# Patient Record
Sex: Male | Born: 1945 | Race: Black or African American | Hispanic: No | Marital: Married | Smoking: Former smoker
Health system: Southern US, Community
[De-identification: ages and names within clinical notes are randomized; demographics above are authoritative.]

## PROBLEM LIST (undated history)

## (undated) DIAGNOSIS — G4730 Sleep apnea, unspecified: Secondary | ICD-10-CM

## (undated) DIAGNOSIS — M1990 Unspecified osteoarthritis, unspecified site: Secondary | ICD-10-CM

## (undated) DIAGNOSIS — J45909 Unspecified asthma, uncomplicated: Secondary | ICD-10-CM

## (undated) DIAGNOSIS — F99 Mental disorder, not otherwise specified: Secondary | ICD-10-CM

## (undated) DIAGNOSIS — K219 Gastro-esophageal reflux disease without esophagitis: Secondary | ICD-10-CM

## (undated) DIAGNOSIS — F4310 Post-traumatic stress disorder, unspecified: Secondary | ICD-10-CM

## (undated) DIAGNOSIS — C801 Malignant (primary) neoplasm, unspecified: Secondary | ICD-10-CM

## (undated) DIAGNOSIS — Z8719 Personal history of other diseases of the digestive system: Secondary | ICD-10-CM

## (undated) DIAGNOSIS — T884XXA Failed or difficult intubation, initial encounter: Secondary | ICD-10-CM

## (undated) DIAGNOSIS — F419 Anxiety disorder, unspecified: Secondary | ICD-10-CM

## (undated) DIAGNOSIS — I10 Essential (primary) hypertension: Secondary | ICD-10-CM

## (undated) DIAGNOSIS — R011 Cardiac murmur, unspecified: Secondary | ICD-10-CM

## (undated) HISTORY — PX: COLONOSCOPY: SHX174

## (undated) HISTORY — PX: MOUTH SURGERY: SHX715

## (undated) HISTORY — PX: FOOT SURGERY: SHX648

## (undated) HISTORY — PX: HAND SURGERY: SHX662

---

## 2001-03-21 ENCOUNTER — Ambulatory Visit (HOSPITAL_BASED_OUTPATIENT_CLINIC_OR_DEPARTMENT_OTHER): Admission: RE | Admit: 2001-03-21 | Discharge: 2001-03-21 | Payer: Self-pay | Admitting: Orthopedic Surgery

## 2001-04-14 ENCOUNTER — Ambulatory Visit (HOSPITAL_COMMUNITY): Admission: RE | Admit: 2001-04-14 | Discharge: 2001-04-14 | Payer: Self-pay | Admitting: Family Medicine

## 2001-04-14 ENCOUNTER — Encounter: Payer: Self-pay | Admitting: Family Medicine

## 2002-07-07 ENCOUNTER — Encounter: Payer: Self-pay | Admitting: *Deleted

## 2002-07-07 ENCOUNTER — Emergency Department (HOSPITAL_COMMUNITY): Admission: EM | Admit: 2002-07-07 | Discharge: 2002-07-07 | Payer: Self-pay | Admitting: Emergency Medicine

## 2003-09-15 ENCOUNTER — Encounter: Payer: Self-pay | Admitting: Orthopedic Surgery

## 2003-10-02 ENCOUNTER — Encounter: Payer: Self-pay | Admitting: Orthopedic Surgery

## 2003-10-02 ENCOUNTER — Ambulatory Visit (HOSPITAL_COMMUNITY): Admission: RE | Admit: 2003-10-02 | Discharge: 2003-10-02 | Payer: Self-pay | Admitting: Orthopedic Surgery

## 2003-10-09 ENCOUNTER — Encounter (HOSPITAL_COMMUNITY): Admission: RE | Admit: 2003-10-09 | Discharge: 2003-11-08 | Payer: Self-pay | Admitting: Orthopedic Surgery

## 2004-03-20 ENCOUNTER — Other Ambulatory Visit: Admission: RE | Admit: 2004-03-20 | Discharge: 2004-03-20 | Payer: Self-pay | Admitting: Dermatology

## 2004-10-03 ENCOUNTER — Ambulatory Visit (HOSPITAL_COMMUNITY): Admission: RE | Admit: 2004-10-03 | Discharge: 2004-10-03 | Payer: Self-pay | Admitting: Internal Medicine

## 2005-06-08 ENCOUNTER — Emergency Department (HOSPITAL_COMMUNITY): Admission: EM | Admit: 2005-06-08 | Discharge: 2005-06-08 | Payer: Self-pay | Admitting: Emergency Medicine

## 2005-11-18 ENCOUNTER — Emergency Department (HOSPITAL_COMMUNITY): Admission: EM | Admit: 2005-11-18 | Discharge: 2005-11-19 | Payer: Self-pay | Admitting: Emergency Medicine

## 2006-04-12 ENCOUNTER — Ambulatory Visit: Payer: Self-pay | Admitting: Orthopedic Surgery

## 2007-04-11 ENCOUNTER — Ambulatory Visit: Payer: Self-pay | Admitting: Orthopedic Surgery

## 2007-04-18 ENCOUNTER — Ambulatory Visit: Payer: Self-pay | Admitting: Orthopedic Surgery

## 2007-04-25 ENCOUNTER — Ambulatory Visit: Payer: Self-pay | Admitting: Orthopedic Surgery

## 2007-05-30 ENCOUNTER — Ambulatory Visit: Payer: Self-pay | Admitting: Orthopedic Surgery

## 2008-01-23 ENCOUNTER — Ambulatory Visit: Payer: Self-pay | Admitting: Orthopedic Surgery

## 2008-01-23 DIAGNOSIS — M25569 Pain in unspecified knee: Secondary | ICD-10-CM | POA: Insufficient documentation

## 2008-01-23 DIAGNOSIS — M1710 Unilateral primary osteoarthritis, unspecified knee: Secondary | ICD-10-CM | POA: Insufficient documentation

## 2010-08-07 ENCOUNTER — Ambulatory Visit (HOSPITAL_COMMUNITY): Admission: RE | Admit: 2010-08-07 | Discharge: 2010-08-07 | Payer: Self-pay | Admitting: Orthopaedic Surgery

## 2011-04-24 NOTE — H&P (Signed)
NAME:  Jon Whitney, Jon Whitney NO.:  [id number]   MEDICAL RECORD NO.:  [id number]                  PATIENT TYPE:   LOCATION:                                       FACILITY:   PHYSICIAN:  Vickki Hearing, M.D.           DATE OF BIRTH:  1946-02-14   DATE OF ADMISSION:  DATE OF DISCHARGE:                                HISTORY & PHYSICAL   CHIEF COMPLAINT:  Left knee pain.   HISTORY:  A 65 year old male, history of left knee injury greater than 10  years ago.  For the last eight months, increasing left knee pain with  decreased ability to walk, get out of a chair, and climb the stairs.  He has  had no giving way episodes but pain and intermittent effusions.  He is also  status post right knee arthroscopy.   REVIEW OF SYSTEMS:  Fatigue, shortness of breath, difficulty breathing,  ulcers, joint pain, depression, mood swings, anxiety, panic attacks, bipolar  disease, schizophrenia, posttraumatic stress disorder, poor vision,  sinusitis, seasonal allergies, sinus problems.  GI, URINARY, NEUROLOGIC,  HEART:  Normal.  SKIN: Normal.  LYMPHATIC:  Normal.   ALLERGIES:  PENICILLIN.   PAST MEDICAL HISTORY:  1. Posttraumatic stress disorder.  2. Depression.   SURGICAL HISTORY:  Right knee arthroscopy.   PHARMACY:  [facility].   MEDICATIONS:  Divalproex, sertraline, furosemide, diclofenac, Xanax.   FAMILY HISTORY:  Asthma, cancer.   SOCIAL HISTORY:  Married.  Currently disabled.  Does not smoke or drink.  Complete his high school education.   PHYSICAL EXAMINATION:  VITAL SIGNS:  Weight is 313, pulse is 84, respiratory  rate is 20.  GENERAL:  He has normal development, grooming, hygiene, without deformities,  well-developed.  He has large body habitus.  CARDIOVASCULAR:  No swelling, no varicosities.  Pulses normal.  EXTREMITIES:  Warm, no edema, no tenderness.  NECK:  Cervical lymph nodes normal.  MUSCULOSKELETAL:  Gait and station antalgic.  The left  knee has a joint  effusion.  He has 95 degrees of motion.  He does fully extend the knee.  Has  some trace anterior drawer on ACL examination.  Overall, muscle strength and  tone normal.  The right knee does not come to full extension and flexes  about 90 degrees.  His upper extremities have normal muscle tone, alignment,  and no joint subluxation.  SKIN:  His skin shows scars from his surgery.  No rashes, lesions, or  ulcers.  NEUROLOGIC:  He has good coordination, reflexes, and sensation.  He is alert  and oriented x3.   LABORATORY DATA:  X-rays show degenerative changes of the medial compartment  - severe, lateral and patellofemoral compartment - mild.  MRI shows torn  ACL, torn medial  meniscus.   DIAGNOSES:  1. Osteoarthritis left knee, 715.16.  2. Medial meniscal tear, 717.2.  3. 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   PLAN:  Arthroscopic partial  medial meniscectomy, debride joint.     ___________________________________________                                         Vickki Hearing, M.D.   SEH/MEDQ  D:  09/15/2003  T:  09/15/2003  Job:  [id number]

## 2011-04-24 NOTE — Op Note (Signed)
[facility]  Patient:    Jon Whitney, Jon Whitney                     MRN: [id number] Proc. Date: 03/21/01 Attending:  Elana Alm. Thurston Hole, M.D.                           Operative Report  PREOPERATIVE DIAGNOSIS:  Right knee medial meniscus tear.  POSTOPERATIVE DIAGNOSES: 1. Right knee medial meniscus tear. 2. Medial compartment chondromalacia.  OPERATION/PROCEDURE: 1. Right knee examination under anesthesia followed by arthroscopic partial medial meniscectomy. 2. Right knee medial compartment chondroplasty.  SURGEON:  Elana Alm. Thurston Hole, M.D.  ASSISTANT:  Julien Girt, P.A.  ANESTHESIA:  Local and MAC anesthesia.  OPERATIVE TIME:  40 minutes.  COMPLICATIONS:  None.  INDICATION FOR PROCEDURE:  Jon Whitney is a 65 year old gentleman who has had three to four months of increasing right knee pain with signs and symptoms consistent with medial meniscus tear who has failed conservative care and is now to undergo arthroscopy.  DESCRIPTION OF PROCEDURE:  Jon Whitney is brought to the operating room on March 21, 2001, after a block had been placed in the holding room.  He is placed on the operating table in supine position.  His right knee was examined under anesthesia.  Range of motion from 0 to 130 degrees, 1+ crepitation, knee stable ligamentous exam with normal patella tracking.  The right leg was prepped using sterile Betadine and draped using sterile technique. Originally, through an inferior lateral portal, the arthroscope with a pump attachment was placed and through an inferior medial portal, an arthroscopic probe was placed.  On initial inspection of the medial compartment, he had 30% grade III chondromalacia of the medial femoral condyle, medial tibial plateau which was debrided. He had a medial meniscus tear posterior medial horn of which 30 to 40% was resected.  A second anterior lateral portal was made for better access to the medial  horn and this was thoroughly debrided back to a stable base and a stable rim.  Intercondylar notch inspected.  Anterior and posterior cruciate ligaments were normal.  Lateral compartment inspected. Articular cartilage, lateral femoral condyle, and lateral tibial plateau was normal.  Lateral meniscus was probed and this was normal.  Patellofemoral joint showed mild grade I to II chondromalacia.  Patella tracked normally. Moderate synovitis in the medial and lateral gutters were debrided, otherwise they were free of pathology.  After this was done, it was felt that all pathology had been satisfactorily addressed.  The instruments were removed. Portals were closed with 3-0 nylon suture and injected with 0.25% Marcaine with epinephrine and 4 mg of morphine.  Sterile dressings applied.  The patient was awakened and taken to the recovery room in stable condition.  FOLLOW-UP CARE:  Jon Whitney will be followed as an outpatient on Vicodin and Naprosyn.  He will see me back in the office in a week for sutures out and follow-up. DD:  03/21/01 TD:  03/21/01 Job: [id number]

## 2011-04-24 NOTE — Op Note (Signed)
NAME:  Jon Whitney, Jon Whitney                      ACCOUNT NO.:  [id number]   MEDICAL RECORD NO.:  [id number]                   PATIENT TYPE:  AMB   LOCATION:  DAY                                  FACILITY:  [facility]   PHYSICIAN:  Vickki Hearing, M.D.           DATE OF BIRTH:  17-Jun-1946   DATE OF PROCEDURE:  DATE OF DISCHARGE:                                 OPERATIVE REPORT   HISTORY:  This is a 65 year old male with left knee injury approximately 10  years ago.  For the last 8 months he has had increasing left knee pain with  decreased ability to walk and get out of a chair, as well as climb the  stairs.  He has had no giving way episodes but has had pain and intermittent  and recurrent knee effusion.  His MRI was done and showed an ACL tear,  osteoarthritis of the knee, and a medial meniscal tear.  He presented for  arthroscopic surgery.   PREOPERATIVE DIAGNOSES:  1. Osteoarthritis left knee.  715.16  2. Medial meniscal tear. 717.2  3. Anterior cruciate ligament tear, chronic.  717.83   POSTOPERATIVE DIAGNOSES:  1. Osteoarthritis left knee.  2. Medical meniscal tear.  3. The anterior cruciate ligament was intact.   PROCEDURE:   SURGEON:  Vickki Hearing, M.D.   OPERATIVE FINDINGS:  The medial meniscus was torn.  It was a straight radial  tear involving 80% of the posterior horn of the medial meniscus.  Beneath  that lesion was a grade 4 lesion of the tibial plateau.  He had grade 3  lesion of the medial femoral condyle.  There were multiple osteophytes in  the medial compartment and there was osteoarthritis and synovitis of the  patellofemoral compartment.  The lateral compartment seemed to be normal.   DETAILS OF PROCEDURE:  The patient was identified as Jon Whitney in the  holding area.  He was given clindamycin due to a PENICILLIN ALLERGY.  His  left knee was marked with my initials.  He had previously marked the site  with an indelible marker.  His chart,  history and physical and consent  confirmed that his left knee was for surgery.  He was then taken to the  operating room for a general anesthetic.  After sterile prep and drape of  the left lower extremity, a time out was taken.  All parties present agreed  that the left knee was the operative site and that the left knee was to have  arthroscopic medial meniscectomy.  Everyone concurred and we proceeded with  completing the prep and then diagnostic arthroscopy through a 3-portal  technique.   Findings:  We noted as stated above.  A straight duckbill forceps was used  to debride the meniscal tear.  A motorized shaver was used to balance the  meniscus and perform a chondroplasty.  We debrided the patellofemoral and  anterior notch compartments.  The meniscus  was probed and the meniscal rim  was found to be intact after meniscectomy.  We irrigated the joint, cleaned  it of remaining debris, closed the portals with Steri-Strips and  injected the knee with 1/2% Sensorcaine 30 cc, applied sterile dressings, a  CryoCuff and Ace bandage.  He was reversed from his anesthetic and taken to  the operating room in stable condition.  He can be full weightbearing as  tolerated with crutches or a walker and follow up in 3 days.      ___________________________________________                                            Vickki Hearing, M.D.   SEH/MEDQ  D:  10/02/2003  T:  10/02/2003  Job:  [id number]

## 2012-09-01 ENCOUNTER — Emergency Department (HOSPITAL_COMMUNITY)
Admission: EM | Admit: 2012-09-01 | Discharge: 2012-09-02 | Disposition: A | Discharge status: Home / Self Care | Payer: Medicare Other | Attending: Emergency Medicine | Admitting: Emergency Medicine

## 2012-09-01 ENCOUNTER — Encounter (HOSPITAL_COMMUNITY): Payer: Self-pay

## 2012-09-01 DIAGNOSIS — Z87891 Personal history of nicotine dependence: Secondary | ICD-10-CM | POA: Insufficient documentation

## 2012-09-01 DIAGNOSIS — Z880 Allergy status to penicillin: Secondary | ICD-10-CM | POA: Insufficient documentation

## 2012-09-01 DIAGNOSIS — J45909 Unspecified asthma, uncomplicated: Secondary | ICD-10-CM | POA: Insufficient documentation

## 2012-09-01 DIAGNOSIS — I10 Essential (primary) hypertension: Secondary | ICD-10-CM | POA: Insufficient documentation

## 2012-09-01 DIAGNOSIS — F4310 Post-traumatic stress disorder, unspecified: Secondary | ICD-10-CM | POA: Insufficient documentation

## 2012-09-01 DIAGNOSIS — G4730 Sleep apnea, unspecified: Secondary | ICD-10-CM | POA: Insufficient documentation

## 2012-09-01 DIAGNOSIS — M129 Arthropathy, unspecified: Secondary | ICD-10-CM | POA: Insufficient documentation

## 2012-09-01 HISTORY — DX: Unspecified asthma, uncomplicated: J45.909

## 2012-09-01 HISTORY — DX: Mental disorder, not otherwise specified: F99

## 2012-09-01 HISTORY — DX: Sleep apnea, unspecified: G47.30

## 2012-09-01 HISTORY — DX: Essential (primary) hypertension: I10

## 2012-09-01 HISTORY — DX: Unspecified osteoarthritis, unspecified site: M19.90

## 2012-09-01 HISTORY — DX: Post-traumatic stress disorder, unspecified: F43.10

## 2012-09-01 HISTORY — DX: Malignant (primary) neoplasm, unspecified: C80.1

## 2012-09-01 LAB — CBC WITH DIFFERENTIAL/PLATELET
Eosinophils Relative: 3 % (ref 0–5)
HCT: 47.1 % (ref 39.0–52.0)
Hemoglobin: 15.6 g/dL (ref 13.0–17.0)
MCH: 28.6 pg (ref 26.0–34.0)
MCHC: 33.1 g/dL (ref 30.0–36.0)
MCV: 86.3 fL (ref 78.0–100.0)
Monocytes Absolute: 0.4 10*3/uL (ref 0.1–1.0)
Monocytes Relative: 6 % (ref 3–12)
RDW: 14.8 % (ref 11.5–15.5)

## 2012-09-01 LAB — BASIC METABOLIC PANEL
BUN: 9 mg/dL (ref 6–23)
CO2: 25 mEq/L (ref 19–32)
Calcium: 9.4 mg/dL (ref 8.4–10.5)
Chloride: 100 mEq/L (ref 96–112)
Creatinine, Ser: 1.19 mg/dL (ref 0.50–1.35)
GFR calc Af Amer: 72 mL/min — ABNORMAL LOW (ref >90)
GFR calc non Af Amer: 62 mL/min — ABNORMAL LOW (ref >90)
Glucose, Bld: 118 mg/dL — ABNORMAL HIGH (ref 70–99)
Potassium: 3.8 mEq/L (ref 3.5–5.1)
Sodium: 135 mEq/L (ref 135–145)

## 2012-09-01 LAB — RAPID URINE DRUG SCREEN, HOSP PERFORMED
Amphetamines: NOT DETECTED
Cocaine: NOT DETECTED
Opiates: POSITIVE — AB
Tetrahydrocannabinol: NOT DETECTED

## 2012-09-01 NOTE — ED Provider Notes (Signed)
Pt seen by [facility] Currently stable, no SI, does not appear psychotic.  He is not a threat to himself or others Stable for d/c  Joya Gaskins, MD 09/01/12 2352

## 2012-09-01 NOTE — ED Notes (Signed)
Patient has not been taking medications. Thinks someone is after him. Placing traps around his home. Started drinking again per IVC papers.
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
## 2012-09-01 NOTE — ED Notes (Signed)
Patient alert and oriented, states that people are out to get him, thinks that people are taking stuff from around his home and are damaging his home. States that he feels like even his family is against him. Admits to drinking today, hasn't been taking his meds. Patient placed in scrubs, belongings locked up and police at bedside at this time. Patient is cooperative. Physician at bedside talking with patient also.
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
## 2012-11-05 ENCOUNTER — Encounter (HOSPITAL_COMMUNITY): Payer: Self-pay

## 2012-11-05 ENCOUNTER — Emergency Department (HOSPITAL_COMMUNITY)
Admission: EM | Admit: 2012-11-05 | Discharge: 2012-11-06 | Disposition: A | Discharge status: Home / Self Care | Payer: Medicare Other | Attending: Emergency Medicine | Admitting: Emergency Medicine

## 2012-11-05 ENCOUNTER — Emergency Department (HOSPITAL_COMMUNITY): Payer: Medicare Other

## 2012-11-05 DIAGNOSIS — M25529 Pain in unspecified elbow: Secondary | ICD-10-CM

## 2012-11-05 DIAGNOSIS — Z8739 Personal history of other diseases of the musculoskeletal system and connective tissue: Secondary | ICD-10-CM | POA: Insufficient documentation

## 2012-11-05 DIAGNOSIS — T07XXXA Unspecified multiple injuries, initial encounter: Secondary | ICD-10-CM

## 2012-11-05 DIAGNOSIS — J45909 Unspecified asthma, uncomplicated: Secondary | ICD-10-CM | POA: Insufficient documentation

## 2012-11-05 DIAGNOSIS — Z87891 Personal history of nicotine dependence: Secondary | ICD-10-CM | POA: Insufficient documentation

## 2012-11-05 DIAGNOSIS — Y33XXXA Other specified events, undetermined intent, initial encounter: Secondary | ICD-10-CM | POA: Insufficient documentation

## 2012-11-05 DIAGNOSIS — Y9289 Other specified places as the place of occurrence of the external cause: Secondary | ICD-10-CM | POA: Insufficient documentation

## 2012-11-05 DIAGNOSIS — Z859 Personal history of malignant neoplasm, unspecified: Secondary | ICD-10-CM | POA: Insufficient documentation

## 2012-11-05 DIAGNOSIS — S40029A Contusion of unspecified upper arm, initial encounter: Secondary | ICD-10-CM | POA: Insufficient documentation

## 2012-11-05 DIAGNOSIS — Z8659 Personal history of other mental and behavioral disorders: Secondary | ICD-10-CM | POA: Insufficient documentation

## 2012-11-05 DIAGNOSIS — Y9382 Activity, spectator at an event: Secondary | ICD-10-CM | POA: Insufficient documentation

## 2012-11-05 DIAGNOSIS — I10 Essential (primary) hypertension: Secondary | ICD-10-CM | POA: Insufficient documentation

## 2012-11-05 DIAGNOSIS — S40019A Contusion of unspecified shoulder, initial encounter: Secondary | ICD-10-CM | POA: Insufficient documentation

## 2012-11-05 DIAGNOSIS — Z79899 Other long term (current) drug therapy: Secondary | ICD-10-CM | POA: Insufficient documentation

## 2012-11-05 MED ORDER — IBUPROFEN 800 MG PO TABS
800.0000 mg | ORAL_TABLET | Freq: Once | ORAL | Status: AC
  Administered 2012-11-05: 800 mg via ORAL
  Filled 2012-11-05: qty 1

## 2012-11-05 MED ORDER — HYDROCODONE-ACETAMINOPHEN 5-325 MG PO TABS
2.0000 | ORAL_TABLET | Freq: Once | ORAL | Status: AC
  Administered 2012-11-05: 2 via ORAL
  Filled 2012-11-05: qty 2

## 2012-11-05 MED ORDER — METHOCARBAMOL 500 MG PO TABS
1000.0000 mg | ORAL_TABLET | Freq: Once | ORAL | Status: AC
  Administered 2012-11-05: 1000 mg via ORAL
  Filled 2012-11-05: qty 2
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
## 2012-11-06 MED ORDER — METHOCARBAMOL 500 MG PO TABS: 500.0000 mg | ORAL_TABLET | Freq: Once | ORAL | Status: DC

## 2012-11-06 MED ORDER — HYDROCODONE-ACETAMINOPHEN 5-325 MG PO TABS: 1.0000 | ORAL_TABLET | Freq: Once | ORAL | Status: DC

## 2012-11-06 MED ORDER — IBUPROFEN 800 MG PO TABS: 800.0000 mg | ORAL_TABLET | Freq: Three times a day (TID) | ORAL | Status: DC | PRN

## 2012-12-30 ENCOUNTER — Other Ambulatory Visit (HOSPITAL_COMMUNITY): Payer: Self-pay | Admitting: Orthopaedic Surgery

## 2012-12-30 DIAGNOSIS — M25562 Pain in left knee: Secondary | ICD-10-CM

## 2013-01-02 ENCOUNTER — Ambulatory Visit (HOSPITAL_COMMUNITY): Payer: Medicare Other

## 2013-01-06 ENCOUNTER — Ambulatory Visit (HOSPITAL_COMMUNITY)
Admission: RE | Admit: 2013-01-06 | Discharge: 2013-01-06 | Disposition: A | Discharge status: Home / Self Care | Payer: Medicare Other | Source: Ambulatory Visit | Attending: Orthopaedic Surgery | Admitting: Orthopaedic Surgery

## 2013-01-06 DIAGNOSIS — M25569 Pain in unspecified knee: Secondary | ICD-10-CM | POA: Insufficient documentation

## 2013-01-06 DIAGNOSIS — M25469 Effusion, unspecified knee: Secondary | ICD-10-CM | POA: Insufficient documentation

## 2013-01-06 DIAGNOSIS — M25669 Stiffness of unspecified knee, not elsewhere classified: Secondary | ICD-10-CM | POA: Insufficient documentation

## 2013-01-06 DIAGNOSIS — IMO0002 Reserved for concepts with insufficient information to code with codable children: Secondary | ICD-10-CM | POA: Insufficient documentation

## 2013-01-06 DIAGNOSIS — M1710 Unilateral primary osteoarthritis, unspecified knee: Secondary | ICD-10-CM | POA: Insufficient documentation

## 2013-01-06 DIAGNOSIS — M25562 Pain in left knee: Secondary | ICD-10-CM

## 2013-01-06 DIAGNOSIS — R937 Abnormal findings on diagnostic imaging of other parts of musculoskeletal system: Secondary | ICD-10-CM | POA: Insufficient documentation

## 2013-01-21 ENCOUNTER — Other Ambulatory Visit: Payer: Self-pay

## 2013-07-12 ENCOUNTER — Other Ambulatory Visit: Payer: Self-pay

## 2013-08-24 ENCOUNTER — Telehealth (HOSPITAL_COMMUNITY): Payer: Self-pay | Admitting: *Deleted

## 2013-08-25 ENCOUNTER — Telehealth: Payer: Self-pay | Admitting: Cardiovascular Disease

## 2013-08-25 ENCOUNTER — Ambulatory Visit (HOSPITAL_COMMUNITY)
Admission: RE | Admit: 2013-08-25 | Discharge: 2013-08-25 | Disposition: A | Discharge status: Home / Self Care | Payer: Medicare Other | Source: Ambulatory Visit | Attending: Cardiovascular Disease | Admitting: Cardiovascular Disease

## 2013-08-25 ENCOUNTER — Other Ambulatory Visit: Payer: Self-pay | Admitting: Cardiovascular Disease

## 2013-08-25 DIAGNOSIS — I359 Nonrheumatic aortic valve disorder, unspecified: Secondary | ICD-10-CM | POA: Insufficient documentation

## 2013-08-25 DIAGNOSIS — I10 Essential (primary) hypertension: Secondary | ICD-10-CM | POA: Insufficient documentation

## 2013-08-25 DIAGNOSIS — Z87891 Personal history of nicotine dependence: Secondary | ICD-10-CM | POA: Insufficient documentation

## 2013-08-25 LAB — CBC WITH DIFFERENTIAL/PLATELET
Basophils Absolute: 0 10*3/uL (ref 0.0–0.1)
HCT: 45.6 % (ref 39.0–52.0)
Lymphocytes Relative: 36 % (ref 12–46)
Monocytes Absolute: 0.6 10*3/uL (ref 0.1–1.0)
Neutro Abs: 3.6 10*3/uL (ref 1.7–7.7)
RDW: 15.4 % (ref 11.5–15.5)
WBC: 6.9 10*3/uL (ref 4.0–10.5)

## 2013-08-25 NOTE — Telephone Encounter (Signed)
Kim at [facility].  Stated pt arrived w/o orders for labs.  Stated Dr. Alanda Amass wanted him to have labs.  Reviewed last OV note ([place] pt) and f/u labs ordered.  No paper chart to confirm which labs needed.    JC, LPN notified and labs ordered.  Faxed to [phone].

## 2013-08-25 NOTE — Progress Notes (Signed)
*  PRELIMINARY RESULTS* Echocardiogram 2D Echocardiogram has been performed.  Jon Whitney 08/25/2013, 2:36 PM

## 2013-08-26 LAB — COMPLETE METABOLIC PANEL WITH GFR
Albumin: 3.9 g/dL (ref 3.5–5.2)
Alkaline Phosphatase: 52 U/L (ref 39–117)
BUN: 11 mg/dL (ref 6–23)
GFR, Est Non African American: 62 mL/min
Glucose, Bld: 79 mg/dL (ref 70–99)
Potassium: 4.2 mEq/L (ref 3.5–5.3)
Total Bilirubin: 1.3 mg/dL — ABNORMAL HIGH (ref 0.3–1.2)

## 2013-08-26 LAB — LIPID PANEL
Cholesterol: 143 mg/dL (ref 0–200)
HDL: 39 mg/dL — ABNORMAL LOW (ref >39)
LDL Cholesterol: 86 mg/dL (ref 0–99)
Triglycerides: 90 mg/dL (ref <150)

## 2013-10-12 ENCOUNTER — Other Ambulatory Visit: Payer: Self-pay

## 2014-02-13 ENCOUNTER — Other Ambulatory Visit (HOSPITAL_COMMUNITY): Payer: Self-pay | Admitting: Orthopaedic Surgery

## 2014-02-13 DIAGNOSIS — M545 Low back pain, unspecified: Secondary | ICD-10-CM

## 2014-02-16 ENCOUNTER — Encounter (HOSPITAL_COMMUNITY): Payer: Self-pay

## 2014-02-16 ENCOUNTER — Ambulatory Visit (HOSPITAL_COMMUNITY)
Admission: RE | Admit: 2014-02-16 | Discharge: 2014-02-16 | Disposition: A | Discharge status: Home / Self Care | Payer: Medicare Other | Source: Ambulatory Visit | Attending: Orthopaedic Surgery | Admitting: Orthopaedic Surgery

## 2014-02-16 DIAGNOSIS — M545 Low back pain, unspecified: Secondary | ICD-10-CM | POA: Insufficient documentation

## 2014-02-16 DIAGNOSIS — D1779 Benign lipomatous neoplasm of other sites: Secondary | ICD-10-CM | POA: Insufficient documentation

## 2014-02-16 DIAGNOSIS — M48061 Spinal stenosis, lumbar region without neurogenic claudication: Secondary | ICD-10-CM | POA: Insufficient documentation

## 2014-05-31 ENCOUNTER — Ambulatory Visit (INDEPENDENT_AMBULATORY_CARE_PROVIDER_SITE_OTHER): Payer: Medicare Other | Admitting: Podiatry

## 2014-05-31 ENCOUNTER — Encounter: Payer: Self-pay | Admitting: Podiatry

## 2014-05-31 VITALS — BP 149/86 | HR 86 | Resp 12

## 2014-05-31 DIAGNOSIS — M2021 Hallux rigidus, right foot: Secondary | ICD-10-CM

## 2014-05-31 DIAGNOSIS — M2020 Hallux rigidus, unspecified foot: Secondary | ICD-10-CM

## 2014-05-31 NOTE — Progress Notes (Signed)
   Subjective:    Patient ID: Jon Whitney, male    DOB: 1946/07/07, 68 y.o.   MRN: [id number]  HPI ''CALLUS NEED TO BE TRIM.''   Review of Systems     Objective:   Physical Exam        Assessment & Plan:

## 2014-05-31 NOTE — Progress Notes (Signed)
Subjective:     Patient ID: Jon Whitney, male   DOB: 1946-11-06, 68 y.o.   MRN: [id number]  HPI patient comes in concerned about generalized foot pain in his wanted to go over how he done since his surgery and what hurts   Review of Systems     Objective:   Physical Exam Neurovascular status is unchanged and patient well oriented x3 and is found to have mild limitation of motion first MPJ with keratotic lesion formation submetatarsal of both feet    Assessment:     Mild inflammation around the right first MPJ with keratotic lesions noted plantar both feet do to skin structure and obesity    Plan:     Reviewed condition and discussed these doing well from the surgery but does have lesions which I trimmed today. Will be seen back periodically

## 2014-07-24 ENCOUNTER — Other Ambulatory Visit: Payer: Self-pay | Admitting: Neurosurgery

## 2014-07-24 DIAGNOSIS — M48061 Spinal stenosis, lumbar region without neurogenic claudication: Secondary | ICD-10-CM

## 2014-07-30 ENCOUNTER — Ambulatory Visit
Admission: RE | Admit: 2014-07-30 | Discharge: 2014-07-30 | Disposition: A | Discharge status: Home / Self Care | Payer: Medicare Other | Source: Ambulatory Visit | Attending: Neurosurgery | Admitting: Neurosurgery

## 2014-07-30 VITALS — BP 128/65 | HR 72

## 2014-07-30 DIAGNOSIS — M48061 Spinal stenosis, lumbar region without neurogenic claudication: Secondary | ICD-10-CM

## 2014-07-30 MED ORDER — IOHEXOL 180 MG/ML  SOLN
15.0000 mL | Freq: Once | INTRAMUSCULAR | Status: AC | PRN
  Administered 2014-07-30: 15 mL via INTRATHECAL

## 2014-07-30 MED ORDER — MEPERIDINE HCL 100 MG/ML IJ SOLN
100.0000 mg | Freq: Once | INTRAMUSCULAR | Status: AC
  Administered 2014-07-30: 100 mg via INTRAMUSCULAR

## 2014-07-30 MED ORDER — DIAZEPAM 5 MG PO TABS
5.0000 mg | ORAL_TABLET | Freq: Once | ORAL | Status: AC
  Administered 2014-07-30: 10 mg via ORAL

## 2014-07-30 MED ORDER — ONDANSETRON HCL 4 MG/2ML IJ SOLN
4.0000 mg | Freq: Once | INTRAMUSCULAR | Status: AC
  Administered 2014-07-30: 4 mg via INTRAMUSCULAR

## 2014-07-30 NOTE — Discharge Instructions (Signed)
Myelogram Discharge Instructions  1. Go home and rest quietly for the next 24 hours.  It is important to lie flat for the next 24 hours.  Get up only to go to the restroom.  You may lie in the bed or on a couch on your back, your stomach, your left side or your right side.  You may have one pillow under your head.  You may have pillows between your knees while you are on your side or under your knees while you are on your back.  2. DO NOT drive today.  Recline the seat as far back as it will go, while still wearing your seat belt, on the way home.  3. You may get up to go to the bathroom as needed.  You may sit up for 10 minutes to eat.  You may resume your normal diet and medications unless otherwise indicated.  Drink lots of extra fluids today and tomorrow.  4. The incidence of headache, nausea, or vomiting is about 5% (one in 20 patients).  If you develop a headache, lie flat and drink plenty of fluids until the headache goes away.  Caffeinated beverages may be helpful.  If you develop severe nausea and vomiting or a headache that does not go away with flat bed rest, call [phone].  5. You may resume normal activities after your 24 hours of bed rest is over; however, do not exert yourself strongly or do any heavy lifting tomorrow. If when you get up you have a headache when standing, go back to bed and force fluids for another 24 hours.  6. Call your physician for a follow-up appointment.  The results of your myelogram will be sent directly to your physician by the following day.  7. If you have any questions or if complications develop after you arrive home, please call [phone].  Discharge instructions have been explained to the patient.  The patient, or the person responsible for the patient, fully understands these instructions.    may resume Wellbutrin on Aug. 25, 2015, after 8:30 am.

## 2014-07-30 NOTE — Progress Notes (Signed)
Patient states he has been off Welbutrin for at least the past two days.  Brita Romp, RN

## 2014-08-16 ENCOUNTER — Emergency Department (HOSPITAL_COMMUNITY): Payer: Medicare Other

## 2014-08-16 ENCOUNTER — Emergency Department (HOSPITAL_COMMUNITY)
Admission: EM | Admit: 2014-08-16 | Discharge: 2014-08-16 | Disposition: A | Discharge status: Home / Self Care | Payer: Medicare Other | Attending: Emergency Medicine | Admitting: Emergency Medicine

## 2014-08-16 ENCOUNTER — Encounter (HOSPITAL_COMMUNITY): Payer: Self-pay | Admitting: Emergency Medicine

## 2014-08-16 DIAGNOSIS — M129 Arthropathy, unspecified: Secondary | ICD-10-CM | POA: Diagnosis not present

## 2014-08-16 DIAGNOSIS — Z8669 Personal history of other diseases of the nervous system and sense organs: Secondary | ICD-10-CM | POA: Diagnosis not present

## 2014-08-16 DIAGNOSIS — R110 Nausea: Secondary | ICD-10-CM | POA: Insufficient documentation

## 2014-08-16 DIAGNOSIS — J45901 Unspecified asthma with (acute) exacerbation: Secondary | ICD-10-CM | POA: Diagnosis not present

## 2014-08-16 DIAGNOSIS — Z79899 Other long term (current) drug therapy: Secondary | ICD-10-CM | POA: Insufficient documentation

## 2014-08-16 DIAGNOSIS — Z87891 Personal history of nicotine dependence: Secondary | ICD-10-CM | POA: Insufficient documentation

## 2014-08-16 DIAGNOSIS — R1013 Epigastric pain: Secondary | ICD-10-CM | POA: Diagnosis not present

## 2014-08-16 DIAGNOSIS — R0602 Shortness of breath: Secondary | ICD-10-CM | POA: Diagnosis present

## 2014-08-16 DIAGNOSIS — F4310 Post-traumatic stress disorder, unspecified: Secondary | ICD-10-CM | POA: Diagnosis not present

## 2014-08-16 DIAGNOSIS — I10 Essential (primary) hypertension: Secondary | ICD-10-CM | POA: Insufficient documentation

## 2014-08-16 DIAGNOSIS — R059 Cough, unspecified: Secondary | ICD-10-CM | POA: Insufficient documentation

## 2014-08-16 DIAGNOSIS — Z859 Personal history of malignant neoplasm, unspecified: Secondary | ICD-10-CM | POA: Insufficient documentation

## 2014-08-16 DIAGNOSIS — F489 Nonpsychotic mental disorder, unspecified: Secondary | ICD-10-CM | POA: Insufficient documentation

## 2014-08-16 DIAGNOSIS — K3189 Other diseases of stomach and duodenum: Secondary | ICD-10-CM | POA: Insufficient documentation

## 2014-08-16 DIAGNOSIS — R05 Cough: Secondary | ICD-10-CM

## 2014-08-16 DIAGNOSIS — Z880 Allergy status to penicillin: Secondary | ICD-10-CM | POA: Diagnosis not present

## 2014-08-16 LAB — CBC
HCT: 44.8 % (ref 39.0–52.0)
Hemoglobin: 14.7 g/dL (ref 13.0–17.0)
MCH: 28.6 pg (ref 26.0–34.0)
MCHC: 32.8 g/dL (ref 30.0–36.0)
MCV: 87.2 fL (ref 78.0–100.0)
Platelets: 160 10*3/uL (ref 150–400)
RBC: 5.14 MIL/uL (ref 4.22–5.81)
RDW: 15 % (ref 11.5–15.5)
WBC: 6 10*3/uL (ref 4.0–10.5)

## 2014-08-16 LAB — URINALYSIS, ROUTINE W REFLEX MICROSCOPIC
Bilirubin Urine: NEGATIVE
GLUCOSE, UA: NEGATIVE mg/dL
Ketones, ur: NEGATIVE mg/dL
LEUKOCYTES UA: NEGATIVE
NITRITE: NEGATIVE
Protein, ur: NEGATIVE mg/dL
SPECIFIC GRAVITY, URINE: 1.01 (ref 1.005–1.030)
Urobilinogen, UA: 0.2 mg/dL (ref 0.0–1.0)
pH: 6 (ref 5.0–8.0)

## 2014-08-16 LAB — COMPREHENSIVE METABOLIC PANEL
ALT: 24 U/L (ref 0–53)
ANION GAP: 12 (ref 5–15)
AST: 32 U/L (ref 0–37)
Albumin: 3.8 g/dL (ref 3.5–5.2)
Alkaline Phosphatase: 62 U/L (ref 39–117)
BILIRUBIN TOTAL: 0.7 mg/dL (ref 0.3–1.2)
BUN: 10 mg/dL (ref 6–23)
CHLORIDE: 101 meq/L (ref 96–112)
CO2: 25 mEq/L (ref 19–32)
CREATININE: 1.15 mg/dL (ref 0.50–1.35)
Calcium: 8.9 mg/dL (ref 8.4–10.5)
GFR calc Af Amer: 74 mL/min — ABNORMAL LOW (ref >90)
GFR calc non Af Amer: 64 mL/min — ABNORMAL LOW (ref >90)
Glucose, Bld: 140 mg/dL — ABNORMAL HIGH (ref 70–99)
POTASSIUM: 3.9 meq/L (ref 3.7–5.3)
Sodium: 138 mEq/L (ref 137–147)
TOTAL PROTEIN: 7.6 g/dL (ref 6.0–8.3)

## 2014-08-16 LAB — URINE MICROSCOPIC-ADD ON

## 2014-08-16 LAB — TROPONIN I

## 2014-08-16 LAB — CBG MONITORING, ED: GLUCOSE-CAPILLARY: 133 mg/dL — AB (ref 70–99)

## 2014-08-16 MED ORDER — SODIUM CHLORIDE 0.9 % IV BOLUS (SEPSIS)
1000.0000 mL | Freq: Once | INTRAVENOUS | Status: AC
  Administered 2014-08-16: 1000 mL via INTRAVENOUS

## 2014-08-16 MED ORDER — ONDANSETRON 8 MG PO TBDP: 8.0000 mg | ORAL_TABLET | Freq: Three times a day (TID) | ORAL | Status: DC | PRN

## 2014-08-16 MED ORDER — SODIUM CHLORIDE 0.9 % IV BOLUS (SEPSIS): 1000.0000 mL | Freq: Once | INTRAVENOUS | Status: DC

## 2014-08-16 MED ORDER — ONDANSETRON HCL 4 MG/2ML IJ SOLN
4.0000 mg | Freq: Once | INTRAMUSCULAR | Status: AC
  Administered 2014-08-16: 4 mg via INTRAVENOUS
  Filled 2014-08-16: qty 2

## 2014-08-16 NOTE — ED Provider Notes (Signed)
CSN: [id number]     Arrival date & time 08/16/14  0518 History   First MD Initiated Contact with Patient 08/16/14 [phone]     Chief Complaint  Patient presents with  . Shortness of Breath      HPI Patient presents the emergency department complaining of difficulty breathing over the past several hours.  States that his breathing has been an issue in the past couple days and his had some cough.  He reports mild productivity.  He also reports some belching.  He describes indigestion.  He denies vomiting but does report some nausea.  He denies diarrhea.  He feels that his been constipated the past 6 days.  When he awoke this morning he felt slightly short of breath and went to the restroom to have a bowel movement.  It is from there that he became lightheaded when he stood up.  No syncope.  No preceding palpitations.  Denies anterior chest tightness at this time.  No unilateral leg swelling.  Patient and spouse deny orthopnea or dyspnea on exertion.  Wife also reports that she feels as though his had some increased confusion.  After further discussions with her regarding his confusion she feels as though it's been worsening over the past year and not as acute as she initially made it sound.  Spouse reports the patient's appetite has been good.     Past Medical History  Diagnosis Date  . Psychiatric disorder   . Hypertension   . Asthma   . Sleep apnea   . PTSD (post-traumatic stress disorder)   . Arthritis   . Cancer    Past Surgical History  Procedure Laterality Date  . Hand surgery    . Foot surgery     History reviewed. No pertinent family history. History  Substance Use Topics  . Smoking status: Former Research scientist (life sciences)  . Smokeless tobacco: Not on file  . Alcohol Use: Yes     Comment: occasionally    Review of Systems  All other systems reviewed and are negative.     Allergies  Penicillins  Home Medications   Prior to Admission medications   Medication Sig Start Date End Date  Taking? Authorizing Provider  ALPRAZolam Duanne Moron) 0.5 MG tablet Take 0.5 mg by mouth 3 (three) times daily.    Historical Provider, MD  amLODipine (NORVASC) 10 MG tablet Take 10 mg by mouth daily.    Historical Provider, MD  fluticasone (FLONASE) 50 MCG/ACT nasal spray Place 2 sprays into the nose daily.    Historical Provider, MD  HYDROcodone-acetaminophen (NORCO) 7.5-325 MG per tablet Take 1 tablet by mouth every 4 (four) hours as needed. pain 08/30/12   Historical Provider, MD  HYDROcodone-acetaminophen (NORCO/VICODIN) 5-325 MG per tablet Take 1-2 tablets by mouth once. 11/06/12   John-Adam Bonk, MD  ibuprofen (ADVIL,MOTRIN) 800 MG tablet Take 1 tablet (800 mg total) by mouth every 8 (eight) hours as needed for pain. 11/06/12   John-Adam Bonk, MD  indomethacin (INDOCIN) 50 MG capsule Take 50 mg by mouth daily.    Historical Provider, MD  metaxalone (SKELAXIN) 800 MG tablet Take 800 mg by mouth 3 (three) times daily.    Historical Provider, MD  methocarbamol (ROBAXIN) 500 MG tablet Take 1-2 tablets (500-1,000 mg total) by mouth once. 11/06/12   John-Adam Bonk, MD  sildenafil (VIAGRA) 100 MG tablet Take 100 mg by mouth daily as needed. For intercourse    Historical Provider, MD  simvastatin (ZOCOR) 40 MG tablet Take 20 mg  by mouth every evening.    Historical Provider, MD   BP 98/81  Pulse 85  Temp(Src) 97.9 F (36.6 C) (Oral)  Resp 17  Ht 5\' 11"  (1.803 m)  Wt 318 lb (144.244 kg)  BMI 44.37 kg/m2  SpO2 100% Physical Exam  Nursing note and vitals reviewed. Constitutional: He is oriented to person, place, and time. He appears well-developed and well-nourished.  HENT:  Head: Normocephalic and atraumatic.  Eyes: EOM are normal.  Neck: Normal range of motion. Neck supple.  Cardiovascular: Normal rate, regular rhythm, normal heart sounds and intact distal pulses.   Pulmonary/Chest: Effort normal and breath sounds normal. No stridor. No respiratory distress. He has no wheezes. He has no rales.   Abdominal: Soft. He exhibits no distension. There is no tenderness. There is no rebound and no guarding.  Musculoskeletal: Normal range of motion.  Neurological: He is alert and oriented to person, place, and time.  Skin: Skin is warm and dry.  Psychiatric: He has a normal mood and affect. Judgment normal.    ED Course  Procedures (including critical care time) Labs Review Labs Reviewed  COMPREHENSIVE METABOLIC PANEL - Abnormal; Notable for the following:    Glucose, Bld 140 (*)    GFR calc non Af Amer 64 (*)    GFR calc Af Amer 74 (*)    All other components within normal limits  URINALYSIS, ROUTINE W REFLEX MICROSCOPIC - Abnormal; Notable for the following:    Hgb urine dipstick TRACE (*)    All other components within normal limits  CBG MONITORING, ED - Abnormal; Notable for the following:    Glucose-Capillary 133 (*)    All other components within normal limits  CBC  TROPONIN I  URINE MICROSCOPIC-ADD ON    Imaging Review Dg Chest 2 View  08/16/2014   CLINICAL DATA:  Sudden onset difficulty breathing and coughing 2 hr ago.  EXAM: CHEST  2 VIEW  COMPARISON:  None.  FINDINGS: Shallow inspiration. Mild cardiac enlargement. No pulmonary vascular congestion or edema. No consolidation or airspace disease. No blunting of costophrenic angles. No pneumothorax.  IMPRESSION: Cardiac enlargement.  No evidence of active pulmonary disease.   Electronically Signed   By: Lucienne Capers M.D.   On: 08/16/2014 06:42   Ct Head Wo Contrast  08/16/2014   CLINICAL DATA:  Altered mental status. Sudden onset difficulty breathing 2 hr ago with coughing.  EXAM: CT HEAD WITHOUT CONTRAST  TECHNIQUE: Contiguous axial images were obtained from the base of the skull through the vertex without intravenous contrast.  COMPARISON:  None.  FINDINGS: Ventricles and sulci appear symmetrical. No ventricular dilatation. Calcification in the basal ganglia bilaterally. No mass effect or midline shift. No abnormal  extra-axial fluid collections. Gray-white matter junctions are distinct. Basal cisterns are not effaced. No evidence of acute intracranial hemorrhage. No depressed skull fractures. Mucosal thickening in the paranasal sinuses. Partial opacification of left mastoid air cells. Vascular calcifications.  IMPRESSION: No acute intracranial abnormalities suggested.   Electronically Signed   By: Lucienne Capers M.D.   On: 08/16/2014 06:37     EKG Interpretation   Date/Time:  Thursday August 16 2014 05:33:15 EDT Ventricular Rate:  86 PR Interval:  7 QRS Duration: 97 QT Interval:  353 QTC Calculation: 422 R Axis:   46 Text Interpretation:  Sinus rhythm Paired ventricular premature complexes  Short PR interval Biatrial enlargement Borderline T wave abnormalities No  old tracing to compare Confirmed by Martha Soltys  MD, Merrel Crabbe ([id number]) on  08/16/2014  5:44:14 AM      MDM   Final diagnoses:  Cough  Nausea  SOB (shortness of breath)    7:19 AM Patient feels much better at this time.  Complete resolution of all of his multitude of symptoms.  Unclear etiology to explain all of this.  He feels better after antiemetics.  She's no longer belching.  I doubt this is ACS.  Low suspicion for intra-abdominal pathology.  Repeat abdominal exam is nontender.  PCP followup.  Patient understands to return to the ER for new or worsening symptoms.    Hoy Morn, MD 08/16/14 [phone]

## 2014-08-16 NOTE — ED Notes (Signed)
Patient complaining of sudden onset of difficulty breathing approximately 2 hours ago. States he started coughing at same time. Patient also complaining of constipation x 1 week.

## 2014-08-20 ENCOUNTER — Other Ambulatory Visit: Payer: Self-pay | Admitting: Neurosurgery

## 2014-08-23 ENCOUNTER — Encounter (HOSPITAL_COMMUNITY): Payer: Self-pay | Admitting: Pharmacy Technician

## 2014-08-27 ENCOUNTER — Encounter (HOSPITAL_COMMUNITY)
Admission: RE | Admit: 2014-08-27 | Discharge: 2014-08-27 | Disposition: A | Discharge status: Home / Self Care | Payer: Medicare Other | Source: Ambulatory Visit | Attending: Neurosurgery | Admitting: Neurosurgery

## 2014-08-27 ENCOUNTER — Other Ambulatory Visit (HOSPITAL_COMMUNITY): Payer: Self-pay | Admitting: *Deleted

## 2014-08-27 ENCOUNTER — Encounter (HOSPITAL_COMMUNITY): Payer: Self-pay

## 2014-08-27 HISTORY — DX: Failed or difficult intubation, initial encounter: T88.4XXA

## 2014-08-27 HISTORY — DX: Anxiety disorder, unspecified: F41.9

## 2014-08-27 HISTORY — DX: Cardiac murmur, unspecified: R01.1

## 2014-08-27 HISTORY — DX: Gastro-esophageal reflux disease without esophagitis: K21.9

## 2014-08-27 HISTORY — DX: Personal history of other diseases of the digestive system: Z87.19

## 2014-08-27 LAB — SURGICAL PCR SCREEN
MRSA, PCR: NEGATIVE
STAPHYLOCOCCUS AUREUS: NEGATIVE
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
## 2014-08-27 NOTE — Pre-Procedure Instructions (Signed)
Jon Whitney  08/27/2014   Your procedure is scheduled on:  Thursday, August 30, 2014 at 1:20 PM.   Report to [facility] Entrance "A" Admitting Office at 10:15 AM.   Call this number if you have problems the morning of surgery: [phone]   Remember:   Do not eat food or drink liquids after midnight Wednesday, 08/29/14.   Take these medicines the morning of surgery with A SIP OF WATER: ALPRAZolam (XANAX), amLODipine (NORVASC), omeprazole (PRILOSEC), hydrOXYzine (ATARAX/VISTARIL), fluticasone (FLONASE), HYDROcodone-acetaminophen (NORCO/VICODIN) - if needed, ondansetron (ZOFRAN ODT) - if needed, may use Albuterol inhaler if needed.   Stop Vitamins, Indocin and Voltaren as of today (if not already stopped).  Bring inhaler with you day of surgery.   Do not wear jewelry.  Do not wear lotions, powders, or cologne. You may wear deodorant.  Men may shave face and neck.  Do not bring valuables to the hospital.  [facility] is not responsible                  for any belongings or valuables.               Contacts, dentures or bridgework may not be worn into surgery.  Leave suitcase in the car. After surgery it may be brought to your room.  For patients admitted to the hospital, discharge time is determined by your                treatment team.            Special Instructions: [facility] - Preparing for Surgery  Before surgery, you can play an important role.  Because skin is not sterile, your skin needs to be as free of germs as possible.  You can reduce the number of germs on you skin by washing with CHG (chlorahexidine gluconate) soap before surgery.  CHG is an antiseptic cleaner which kills germs and bonds with the skin to continue killing germs even after washing.  Please DO NOT use if you have an allergy to CHG or antibacterial soaps.  If your skin becomes reddened/irritated stop using the CHG and inform your nurse when you arrive at Short Stay.  Do not shave  (including legs and underarms) for at least 48 hours prior to the first CHG shower.  You may shave your face.  Please follow these instructions carefully:   1.  Shower with CHG Soap the night before surgery and the                                morning of Surgery.  2.  If you choose to wash your hair, wash your hair first as usual with your       normal shampoo.  3.  After you shampoo, rinse your hair and body thoroughly to remove the                      Shampoo.  4.  Use CHG as you would any other liquid soap.  You can apply chg directly       to the skin and wash gently with scrungie or a clean washcloth.  5.  Apply the CHG Soap to your body ONLY FROM THE NECK DOWN.        Do not use on open wounds or open sores.  Avoid contact with your eyes, ears, mouth and genitals (private parts).  Wash genitals (private parts) with your normal soap.  6.  Wash thoroughly, paying special attention to the area where your surgery        will be performed.  7.  Thoroughly rinse your body with warm water from the neck down.  8.  DO NOT shower/wash with your normal soap after using and rinsing off       the CHG Soap.  9.  Pat yourself dry with a clean towel.            10.  Wear clean pajamas.            11.  Place clean sheets on your bed the night of your first shower and do not        sleep with pets.  Day of Surgery  Do not apply any lotions/deoderants the morning of surgery.  Please wear clean clothes to the hospital/surgery center.     Please read over the following fact sheets that you were given: Pain Booklet, Coughing and Deep Breathing, MRSA Information and Surgical Site Infection Prevention

## 2014-08-27 NOTE — Anesthesia Preprocedure Evaluation (Addendum)
Anesthesia Evaluation  Patient identified by MRN, date of birth, ID bandGeneral Assessment Comment:Airway examined and noted. Palatal plate in place  Reviewed: Allergy & Precautions, H&P , NPO status , Patient's Chart, lab work & pertinent test results, reviewed documented beta blocker date and time   History of Anesthesia Complications (+) DIFFICULT AIRWAY and history of anesthetic complications  Airway Mallampati: II TM Distance: >3 FB Neck ROM: Full    Dental  (+) Dental Advisory Given, Teeth Intact Wears removable palatal obturator:   Pulmonary asthma , sleep apnea , former smoker,  breath sounds clear to auscultation  Pulmonary exam normal       Cardiovascular hypertension, Rhythm:Regular     Neuro/Psych    GI/Hepatic Neg liver ROS, hiatal hernia, GERD-  Controlled and Medicated,  Endo/Other  negative endocrine ROSMorbid obesity  Renal/GU negative Renal ROS     Musculoskeletal  (+) Arthritis -,   Abdominal (+) + obese,   Peds  Hematology   Anesthesia Other Findings   Reproductive/Obstetrics                        Anesthesia Physical Anesthesia Plan  ASA: III  Anesthesia Plan: General   Post-op Pain Management:    Induction: Intravenous  Airway Management Planned: Oral ETT  Additional Equipment:   Intra-op Plan:   Post-operative Plan: Extubation in OR  Informed Consent: I have reviewed the patients History and Physical, chart, labs and discussed the procedure including the risks, benefits and alternatives for the proposed anesthesia with the patient or authorized representative who has indicated his/her understanding and acceptance.   Dental advisory given  Plan Discussed with: CRNA and Surgeon  Anesthesia Plan Comments: (See my anesthesia note.  Have removal palatal obturator.  Discussed with Dr. Orene Desanctis with anticipation for patient to remove appliance prior to induction with  plans to have patient replace after extubation.  Myra Gianotti, PA-C)       Anesthesia Quick Evaluation

## 2014-08-29 MED ORDER — VANCOMYCIN HCL 10 G IV SOLR
1500.0000 mg | INTRAVENOUS | Status: AC
  Administered 2014-08-30: 1500 mg via INTRAVENOUS
  Filled 2014-08-29: qty 1500

## 2014-08-30 ENCOUNTER — Inpatient Hospital Stay (HOSPITAL_COMMUNITY)
Admission: RE | Admit: 2014-08-30 | Discharge: 2014-09-03 | DRG: 518 | Disposition: A | Discharge status: Home / Self Care | Payer: Medicare Other | Source: Ambulatory Visit | Attending: Neurosurgery | Admitting: Neurosurgery

## 2014-08-30 ENCOUNTER — Inpatient Hospital Stay (HOSPITAL_COMMUNITY): Payer: Medicare Other | Admitting: Certified Registered Nurse Anesthetist

## 2014-08-30 ENCOUNTER — Encounter (HOSPITAL_COMMUNITY)
Admission: RE | Disposition: A | Discharge status: Home / Self Care | Payer: Self-pay | Source: Ambulatory Visit | Attending: Neurosurgery

## 2014-08-30 ENCOUNTER — Encounter (HOSPITAL_COMMUNITY): Payer: Medicare Other | Admitting: Vascular Surgery

## 2014-08-30 ENCOUNTER — Inpatient Hospital Stay (HOSPITAL_COMMUNITY): Payer: Medicare Other

## 2014-08-30 ENCOUNTER — Encounter (HOSPITAL_COMMUNITY): Payer: Self-pay | Admitting: Certified Registered Nurse Anesthetist

## 2014-08-30 DIAGNOSIS — Z825 Family history of asthma and other chronic lower respiratory diseases: Secondary | ICD-10-CM

## 2014-08-30 DIAGNOSIS — I10 Essential (primary) hypertension: Secondary | ICD-10-CM | POA: Diagnosis present

## 2014-08-30 DIAGNOSIS — J45909 Unspecified asthma, uncomplicated: Secondary | ICD-10-CM | POA: Diagnosis present

## 2014-08-30 DIAGNOSIS — Z87891 Personal history of nicotine dependence: Secondary | ICD-10-CM | POA: Diagnosis not present

## 2014-08-30 DIAGNOSIS — F4310 Post-traumatic stress disorder, unspecified: Secondary | ICD-10-CM | POA: Diagnosis present

## 2014-08-30 DIAGNOSIS — Z79899 Other long term (current) drug therapy: Secondary | ICD-10-CM | POA: Diagnosis not present

## 2014-08-30 DIAGNOSIS — K219 Gastro-esophageal reflux disease without esophagitis: Secondary | ICD-10-CM | POA: Diagnosis present

## 2014-08-30 DIAGNOSIS — Q762 Congenital spondylolisthesis: Secondary | ICD-10-CM | POA: Diagnosis not present

## 2014-08-30 DIAGNOSIS — M129 Arthropathy, unspecified: Secondary | ICD-10-CM | POA: Diagnosis present

## 2014-08-30 DIAGNOSIS — G4730 Sleep apnea, unspecified: Secondary | ICD-10-CM | POA: Diagnosis present

## 2014-08-30 DIAGNOSIS — M48061 Spinal stenosis, lumbar region without neurogenic claudication: Principal | ICD-10-CM | POA: Diagnosis present

## 2014-08-30 SURGERY — LUMBAR LAMINECTOMY WITH COFLEX 2 LEVEL
Anesthesia: General | Site: Back | Laterality: Bilateral

## 2014-08-30 MED ORDER — HEMOSTATIC AGENTS (NO CHARGE) OPTIME
TOPICAL | Status: DC | PRN
  Administered 2014-08-30: 1 via TOPICAL

## 2014-08-30 MED ORDER — PROMETHAZINE HCL 25 MG/ML IJ SOLN: 6.2500 mg | INTRAMUSCULAR | Status: DC | PRN

## 2014-08-30 MED ORDER — THROMBIN 5000 UNITS EX SOLR
CUTANEOUS | Status: DC | PRN
  Administered 2014-08-30: 5000 [IU] via TOPICAL

## 2014-08-30 MED ORDER — 0.9 % SODIUM CHLORIDE (POUR BTL) OPTIME
TOPICAL | Status: DC | PRN
  Administered 2014-08-30: 1000 mL

## 2014-08-30 MED ORDER — SODIUM CHLORIDE 0.9 % IV SOLN: 250.0000 mL | INTRAVENOUS | Status: DC

## 2014-08-30 MED ORDER — THROMBIN 20000 UNITS EX SOLR
CUTANEOUS | Status: DC | PRN
  Administered 2014-08-30: 15:00:00 via TOPICAL

## 2014-08-30 MED ORDER — GLYCOPYRROLATE 0.2 MG/ML IJ SOLN
INTRAMUSCULAR | Status: AC
  Filled 2014-08-30: qty 2

## 2014-08-30 MED ORDER — ZOLPIDEM TARTRATE 5 MG PO TABS: 5.0000 mg | ORAL_TABLET | Freq: Every evening | ORAL | Status: DC | PRN

## 2014-08-30 MED ORDER — KCL IN DEXTROSE-NACL 20-5-0.45 MEQ/L-%-% IV SOLN: 80.0000 mL/h | INTRAVENOUS | Status: DC

## 2014-08-30 MED ORDER — CYCLOBENZAPRINE HCL 10 MG PO TABS
10.0000 mg | ORAL_TABLET | Freq: Three times a day (TID) | ORAL | Status: DC | PRN
  Administered 2014-08-31 – 2014-09-02 (×3): 10 mg via ORAL
  Filled 2014-08-30 (×3): qty 1

## 2014-08-30 MED ORDER — LIDOCAINE HCL (CARDIAC) 20 MG/ML IV SOLN
INTRAVENOUS | Status: DC | PRN
  Administered 2014-08-30: 100 mg via INTRAVENOUS

## 2014-08-30 MED ORDER — BUPIVACAINE HCL (PF) 0.5 % IJ SOLN
INTRAMUSCULAR | Status: DC | PRN
  Administered 2014-08-30: 20 mL

## 2014-08-30 MED ORDER — SIMVASTATIN 5 MG PO TABS
5.0000 mg | ORAL_TABLET | Freq: Every day | ORAL | Status: DC
  Administered 2014-08-31 – 2014-09-02 (×3): 5 mg via ORAL
  Filled 2014-08-30 (×3): qty 1

## 2014-08-30 MED ORDER — NEOSTIGMINE METHYLSULFATE 10 MG/10ML IV SOLN
INTRAVENOUS | Status: DC | PRN
  Administered 2014-08-30: 4 mg via INTRAVENOUS

## 2014-08-30 MED ORDER — HYDROMORPHONE HCL 1 MG/ML IJ SOLN
INTRAMUSCULAR | Status: AC
  Filled 2014-08-30: qty 1

## 2014-08-30 MED ORDER — GLYCOPYRROLATE 0.2 MG/ML IJ SOLN
INTRAMUSCULAR | Status: DC | PRN
  Administered 2014-08-30 (×2): 0.2 mg via INTRAVENOUS
  Administered 2014-08-30: .8 mg via INTRAVENOUS

## 2014-08-30 MED ORDER — OXYCODONE HCL 5 MG/5ML PO SOLN: 5.0000 mg | Freq: Once | ORAL | Status: AC | PRN

## 2014-08-30 MED ORDER — ARTIFICIAL TEARS OP OINT
TOPICAL_OINTMENT | OPHTHALMIC | Status: AC
  Filled 2014-08-30: qty 3.5

## 2014-08-30 MED ORDER — VANCOMYCIN HCL 10 G IV SOLR
1500.0000 mg | Freq: Two times a day (BID) | INTRAVENOUS | Status: DC
  Administered 2014-08-31 – 2014-09-02 (×7): 1500 mg via INTRAVENOUS
  Filled 2014-08-30 (×8): qty 1500

## 2014-08-30 MED ORDER — SODIUM CHLORIDE 0.9 % IJ SOLN: 3.0000 mL | INTRAMUSCULAR | Status: DC | PRN

## 2014-08-30 MED ORDER — KETOROLAC TROMETHAMINE 30 MG/ML IJ SOLN
30.0000 mg | Freq: Four times a day (QID) | INTRAMUSCULAR | Status: DC
  Administered 2014-08-30 – 2014-09-03 (×14): 30 mg via INTRAVENOUS
  Filled 2014-08-30 (×14): qty 1

## 2014-08-30 MED ORDER — ARTIFICIAL TEARS OP OINT
TOPICAL_OINTMENT | OPHTHALMIC | Status: DC | PRN
  Administered 2014-08-30: 1 via OPHTHALMIC

## 2014-08-30 MED ORDER — OXYCODONE HCL 5 MG PO TABS
5.0000 mg | ORAL_TABLET | Freq: Once | ORAL | Status: AC | PRN
  Administered 2014-08-30: 5 mg via ORAL

## 2014-08-30 MED ORDER — DOCUSATE SODIUM 100 MG PO CAPS
200.0000 mg | ORAL_CAPSULE | Freq: Every day | ORAL | Status: DC
  Administered 2014-08-30 – 2014-09-01 (×3): 200 mg via ORAL
  Administered 2014-09-02: 100 mg via ORAL
  Administered 2014-09-03: 200 mg via ORAL
  Filled 2014-08-30 (×5): qty 2

## 2014-08-30 MED ORDER — SUCCINYLCHOLINE CHLORIDE 20 MG/ML IJ SOLN
INTRAMUSCULAR | Status: DC | PRN
  Administered 2014-08-30: 120 mg via INTRAVENOUS

## 2014-08-30 MED ORDER — ALBUTEROL SULFATE (2.5 MG/3ML) 0.083% IN NEBU
3.0000 mL | INHALATION_SOLUTION | Freq: Four times a day (QID) | RESPIRATORY_TRACT | Status: DC | PRN
  Administered 2014-08-31 (×2): 3 mL via RESPIRATORY_TRACT
  Filled 2014-08-30 (×2): qty 3

## 2014-08-30 MED ORDER — ONDANSETRON HCL 4 MG/2ML IJ SOLN
INTRAMUSCULAR | Status: AC
  Filled 2014-08-30: qty 2

## 2014-08-30 MED ORDER — HYDROMORPHONE HCL 1 MG/ML IJ SOLN
0.2500 mg | INTRAMUSCULAR | Status: DC | PRN
  Administered 2014-08-30 (×4): 0.5 mg via INTRAVENOUS

## 2014-08-30 MED ORDER — MENTHOL 3 MG MT LOZG: 1.0000 | LOZENGE | OROMUCOSAL | Status: DC | PRN

## 2014-08-30 MED ORDER — LACTATED RINGERS IV SOLN
INTRAVENOUS | Status: DC
  Administered 2014-08-30: 11:00:00 via INTRAVENOUS

## 2014-08-30 MED ORDER — LACTATED RINGERS IV SOLN
INTRAVENOUS | Status: DC | PRN
  Administered 2014-08-30 (×3): via INTRAVENOUS

## 2014-08-30 MED ORDER — PANTOPRAZOLE SODIUM 40 MG IV SOLR
40.0000 mg | Freq: Every day | INTRAVENOUS | Status: DC
  Administered 2014-08-30: 40 mg via INTRAVENOUS
  Filled 2014-08-30: qty 40

## 2014-08-30 MED ORDER — ACETAMINOPHEN 650 MG RE SUPP: 650.0000 mg | RECTAL | Status: DC | PRN

## 2014-08-30 MED ORDER — ROCURONIUM BROMIDE 100 MG/10ML IV SOLN
INTRAVENOUS | Status: DC | PRN
  Administered 2014-08-30 (×2): 20 mg via INTRAVENOUS
  Administered 2014-08-30: 50 mg via INTRAVENOUS

## 2014-08-30 MED ORDER — EPHEDRINE SULFATE 50 MG/ML IJ SOLN
INTRAMUSCULAR | Status: DC | PRN
  Administered 2014-08-30: 10 mg via INTRAVENOUS
  Administered 2014-08-30: 5 mg via INTRAVENOUS
  Administered 2014-08-30: 10 mg via INTRAVENOUS
  Administered 2014-08-30 (×2): 5 mg via INTRAVENOUS

## 2014-08-30 MED ORDER — MIDAZOLAM HCL 5 MG/5ML IJ SOLN
INTRAMUSCULAR | Status: DC | PRN
  Administered 2014-08-30 (×2): 1 mg via INTRAVENOUS

## 2014-08-30 MED ORDER — MORPHINE SULFATE 2 MG/ML IJ SOLN
1.0000 mg | INTRAMUSCULAR | Status: DC | PRN
  Administered 2014-08-30 – 2014-08-31 (×3): 2 mg via INTRAVENOUS
  Administered 2014-08-31 – 2014-09-03 (×2): 4 mg via INTRAVENOUS
  Administered 2014-09-03: 2 mg via INTRAVENOUS
  Filled 2014-08-30 (×2): qty 1
  Filled 2014-08-30: qty 2
  Filled 2014-08-30 (×2): qty 1
  Filled 2014-08-30: qty 2

## 2014-08-30 MED ORDER — OXYCODONE HCL 5 MG PO TABS
ORAL_TABLET | ORAL | Status: AC
  Filled 2014-08-30: qty 1

## 2014-08-30 MED ORDER — ACETAMINOPHEN 325 MG PO TABS: 650.0000 mg | ORAL_TABLET | ORAL | Status: DC | PRN

## 2014-08-30 MED ORDER — HYDROCODONE-ACETAMINOPHEN 5-325 MG PO TABS
1.0000 | ORAL_TABLET | ORAL | Status: DC | PRN
  Administered 2014-08-31 – 2014-09-02 (×11): 2 via ORAL
  Filled 2014-08-30 (×12): qty 2

## 2014-08-30 MED ORDER — SODIUM CHLORIDE 0.9 % IR SOLN
Status: DC | PRN
  Administered 2014-08-30 (×2)

## 2014-08-30 MED ORDER — PROPOFOL 10 MG/ML IV BOLUS
INTRAVENOUS | Status: DC | PRN
  Administered 2014-08-30: 200 mg via INTRAVENOUS

## 2014-08-30 MED ORDER — ONDANSETRON HCL 4 MG/2ML IJ SOLN: 4.0000 mg | INTRAMUSCULAR | Status: DC | PRN

## 2014-08-30 MED ORDER — PROPOFOL 10 MG/ML IV BOLUS
INTRAVENOUS | Status: AC
  Filled 2014-08-30: qty 20

## 2014-08-30 MED ORDER — SODIUM CHLORIDE 0.9 % IJ SOLN
3.0000 mL | Freq: Two times a day (BID) | INTRAMUSCULAR | Status: DC
  Administered 2014-09-01 – 2014-09-03 (×4): 3 mL via INTRAVENOUS

## 2014-08-30 MED ORDER — ONDANSETRON HCL 4 MG/2ML IJ SOLN
INTRAMUSCULAR | Status: DC | PRN
  Administered 2014-08-30: 4 mg via INTRAVENOUS

## 2014-08-30 MED ORDER — PHENOL 1.4 % MT LIQD
1.0000 | OROMUCOSAL | Status: DC | PRN
  Administered 2014-08-31: 1 via OROMUCOSAL
  Filled 2014-08-30: qty 177

## 2014-08-30 MED ORDER — GLYCOPYRROLATE 0.2 MG/ML IJ SOLN
INTRAMUSCULAR | Status: AC
  Filled 2014-08-30: qty 1

## 2014-08-30 MED ORDER — AMLODIPINE BESYLATE 10 MG PO TABS
10.0000 mg | ORAL_TABLET | Freq: Every day | ORAL | Status: DC
  Administered 2014-08-31 – 2014-09-03 (×4): 10 mg via ORAL
  Filled 2014-08-30 (×4): qty 1

## 2014-08-30 MED ORDER — MIDAZOLAM HCL 2 MG/2ML IJ SOLN
INTRAMUSCULAR | Status: AC
  Filled 2014-08-30: qty 2

## 2014-08-30 MED ORDER — FENTANYL CITRATE 0.05 MG/ML IJ SOLN
INTRAMUSCULAR | Status: DC | PRN
  Administered 2014-08-30 (×2): 50 ug via INTRAVENOUS
  Administered 2014-08-30: 150 ug via INTRAVENOUS
  Administered 2014-08-30 (×5): 50 ug via INTRAVENOUS

## 2014-08-30 MED ORDER — LIDOCAINE HCL (CARDIAC) 20 MG/ML IV SOLN
INTRAVENOUS | Status: AC
  Filled 2014-08-30: qty 5

## 2014-08-30 MED ORDER — FENTANYL CITRATE 0.05 MG/ML IJ SOLN
INTRAMUSCULAR | Status: AC
  Filled 2014-08-30: qty 5

## 2014-08-30 SURGICAL SUPPLY — 63 items
ADH SKN CLS APL DERMABOND .7 (GAUZE/BANDAGES/DRESSINGS) ×1
ADH SKN CLS LQ APL DERMABOND (GAUZE/BANDAGES/DRESSINGS) ×1
APL SKNCLS STERI-STRIP NONHPOA (GAUZE/BANDAGES/DRESSINGS) ×1
BAG DECANTER FOR FLEXI CONT (MISCELLANEOUS) ×3 IMPLANT
BENZOIN TINCTURE PRP APPL 2/3 (GAUZE/BANDAGES/DRESSINGS) ×3 IMPLANT
BLADE SURG ROTATE 9660 (MISCELLANEOUS) ×3 IMPLANT
BRUSH SCRUB EZ PLAIN DRY (MISCELLANEOUS) ×3 IMPLANT
BUR CUTTER 7.0 ROUND (BURR) ×3 IMPLANT
BUR MATCHSTICK NEURO 3.0 LAGG (BURR) IMPLANT
CANISTER SUCT 3000ML (MISCELLANEOUS) ×3 IMPLANT
CLOSURE WOUND 1/2 X4 (GAUZE/BANDAGES/DRESSINGS) ×2
CONT SPEC 4OZ CLIKSEAL STRL BL (MISCELLANEOUS) ×3 IMPLANT
DERMABOND ADHESIVE PROPEN (GAUZE/BANDAGES/DRESSINGS) ×2
DERMABOND ADVANCED (GAUZE/BANDAGES/DRESSINGS) ×2
DERMABOND ADVANCED .7 DNX12 (GAUZE/BANDAGES/DRESSINGS) ×1 IMPLANT
DERMABOND ADVANCED .7 DNX6 (GAUZE/BANDAGES/DRESSINGS) ×1 IMPLANT
DEVICE COFLEX STABLIZATION 10M (Neuro Prosthesis/Implant) ×3 IMPLANT
DEVICE COFLEX STABLIZATION 8MM (Neuro Prosthesis/Implant) ×3 IMPLANT
DRAPE C-ARM 42X72 X-RAY (DRAPES) ×6 IMPLANT
DRAPE LAPAROTOMY 100X72X124 (DRAPES) ×3 IMPLANT
DRAPE SURG 17X23 STRL (DRAPES) ×6 IMPLANT
DRSG OPSITE POSTOP 4X6 (GAUZE/BANDAGES/DRESSINGS) ×3 IMPLANT
DURAPREP 26ML APPLICATOR (WOUND CARE) ×3 IMPLANT
ELECT BLADE 4.0 EZ CLEAN MEGAD (MISCELLANEOUS) ×3
ELECT REM PT RETURN 9FT ADLT (ELECTROSURGICAL) ×3
ELECTRODE BLDE 4.0 EZ CLN MEGD (MISCELLANEOUS) ×1 IMPLANT
ELECTRODE REM PT RTRN 9FT ADLT (ELECTROSURGICAL) ×1 IMPLANT
EVACUATOR 1/8 PVC DRAIN (DRAIN) ×3 IMPLANT
GAUZE SPONGE 4X4 12PLY STRL (GAUZE/BANDAGES/DRESSINGS) ×3 IMPLANT
GAUZE SPONGE 4X4 16PLY XRAY LF (GAUZE/BANDAGES/DRESSINGS) IMPLANT
GLOVE BIO SURGEON STRL SZ7 (GLOVE) ×12 IMPLANT
GLOVE BIO SURGEON STRL SZ8 (GLOVE) ×3 IMPLANT
GLOVE BIOGEL PI IND STRL 8.5 (GLOVE) ×1 IMPLANT
GLOVE BIOGEL PI INDICATOR 8.5 (GLOVE) ×2
GLOVE ECLIPSE 8.0 STRL XLNG CF (GLOVE) ×3 IMPLANT
GLOVE INDICATOR 7.0 STRL GRN (GLOVE) ×6 IMPLANT
GLOVE INDICATOR 8.5 STRL (GLOVE) ×3 IMPLANT
GOWN STRL REUS W/ TWL LRG LVL3 (GOWN DISPOSABLE) ×3 IMPLANT
GOWN STRL REUS W/ TWL XL LVL3 (GOWN DISPOSABLE) ×1 IMPLANT
GOWN STRL REUS W/TWL 2XL LVL3 (GOWN DISPOSABLE) IMPLANT
GOWN STRL REUS W/TWL LRG LVL3 (GOWN DISPOSABLE) ×9
GOWN STRL REUS W/TWL XL LVL3 (GOWN DISPOSABLE) ×3
KIT BASIN OR (CUSTOM PROCEDURE TRAY) ×3 IMPLANT
KIT ROOM TURNOVER OR (KITS) ×3 IMPLANT
NEEDLE HYPO 22GX1.5 SAFETY (NEEDLE) ×3 IMPLANT
NEEDLE SPNL 22GX3.5 QUINCKE BK (NEEDLE) ×6 IMPLANT
NS IRRIG 1000ML POUR BTL (IV SOLUTION) ×3 IMPLANT
PACK LAMINECTOMY NEURO (CUSTOM PROCEDURE TRAY) ×3 IMPLANT
PAD ARMBOARD 7.5X6 YLW CONV (MISCELLANEOUS) ×9 IMPLANT
PATTIES SURGICAL .75X.75 (GAUZE/BANDAGES/DRESSINGS) ×3 IMPLANT
RUBBERBAND STERILE (MISCELLANEOUS) IMPLANT
SPONGE LAP 18X18 X RAY DECT (DISPOSABLE) ×3 IMPLANT
SPONGE LAP 4X18 X RAY DECT (DISPOSABLE) ×3 IMPLANT
SPONGE SURGIFOAM ABS GEL SZ50 (HEMOSTASIS) ×3 IMPLANT
STRIP CLOSURE SKIN 1/2X4 (GAUZE/BANDAGES/DRESSINGS) ×4 IMPLANT
SUT PROLENE 0 CT 1 30 (SUTURE) ×3 IMPLANT
SUT VIC AB 2-0 OS6 18 (SUTURE) ×15 IMPLANT
SUT VIC AB 3-0 CP2 18 (SUTURE) ×3 IMPLANT
SYR 20ML ECCENTRIC (SYRINGE) ×3 IMPLANT
TAPE CLOTH SURG 4X10 WHT LF (GAUZE/BANDAGES/DRESSINGS) ×3 IMPLANT
TOWEL OR 17X24 6PK STRL BLUE (TOWEL DISPOSABLE) ×3 IMPLANT
TOWEL OR 17X26 10 PK STRL BLUE (TOWEL DISPOSABLE) ×3 IMPLANT
WATER STERILE IRR 1000ML POUR (IV SOLUTION) ×3 IMPLANT

## 2014-08-30 NOTE — Progress Notes (Signed)
ANTIBIOTIC CONSULT NOTE - INITIAL  Pharmacy Consult for Vancomycin Indication: s/p spinal surgery  Allergies  Allergen Reactions  . Penicillins Hives and Rash    Patient Measurements: Height: 5\' 11"  (180.3 cm) Weight: 309 lb 9.6 oz (140.434 kg) IBW/kg (Calculated) : 75.3 Adjusted Body Weight:    Vital Signs: Temp: 97.5 F (36.4 C) (09/24 1902) Temp src: Oral (09/24 1902) BP: 127/62 mmHg (09/24 1902) Pulse Rate: 75 (09/24 1902) Intake/Output from previous day:   Intake/Output from this shift:    Labs: No results found for this basename: WBC, HGB, PLT, LABCREA, CREATININE,  in the last 72 hours Estimated Creatinine Clearance: 88.1 ml/min (by C-G formula based on Cr of 1.15). No results found for this basename: VANCOTROUGH, Corlis Leak, VANCORANDOM, GENTTROUGH, GENTPEAK, GENTRANDOM, TOBRATROUGH, TOBRAPEAK, TOBRARND, AMIKACINPEAK, AMIKACINTROU, AMIKACIN,  in the last 72 hours   Microbiology: Recent Results (from the past 720 hour(s))  SURGICAL PCR SCREEN     Status: None   Collection Time    08/27/14  3:17 PM      Result Value Ref Range Status   MRSA, PCR NEGATIVE  NEGATIVE Final   Staphylococcus aureus NEGATIVE  NEGATIVE Final   Comment:            The Xpert SA Assay (FDA     approved for NASAL specimens     in patients over 48 years of age),     is one component of     a comprehensive surveillance     program.  Test performance has     been validated by [employer] for patients greater     than or equal to 40 year old.     It is not intended     to diagnose infection nor to     guide or monitor treatment.    Medical History: Past Medical History  Diagnosis Date  . Psychiatric disorder   . Hypertension   . Asthma   . PTSD (post-traumatic stress disorder)   . Arthritis   . Sleep apnea     uses cpap  . Heart murmur     as a child "it went away"  . Anxiety   . GERD (gastroesophageal reflux disease)   . H/O hiatal hernia   . 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 9/24 patient underwent spinal surgery with drain remaining. Plan to start Vancomycin for post-op surgical prophylaxis until drain removed. Scr 1.15. CrCl 88  Goal of Therapy:  Vancomycin trough level 10-15 mcg/ml  Plan:  Vancomycin 1500mg  IV q12hrs Trough after 3-5 doses at steady state if continued.   Raizy Auzenne S. Alford Highland, PharmD, BCPS Clinical Staff Pharmacist Pager [phone]  Eilene Ghazi Stillinger 08/30/2014,7:11 PM

## 2014-08-30 NOTE — Progress Notes (Signed)
Patient has home CPAP in his room that he places on/off himself.

## 2014-08-30 NOTE — Anesthesia Postprocedure Evaluation (Signed)
  Anesthesia Post-op Note  Patient: Jon Whitney  Procedure(s) Performed: Procedure(s) with comments: Bilateral Lumbar Two to three, Lumbar Three to Four Laminectomy/Foraminotomy with coflex (Bilateral) - Bilateral L2-3 L3-4 Laminectomy/Foraminotomy with coflex  Patient Location: PACU  Anesthesia Type:General  Level of Consciousness: awake and alert   Airway and Oxygen Therapy: Patient Spontanous Breathing  Post-op Pain: mild  Post-op Assessment: Post-op Vital signs reviewed  Post-op Vital Signs: stable  Last Vitals:  Filed Vitals:   08/30/14 1725  BP:   Pulse: 70  Temp:   Resp: 18    Complications: No apparent anesthesia complications

## 2014-08-30 NOTE — Anesthesia Procedure Notes (Addendum)
Procedure Name: Intubation Date/Time: 08/30/2014 12:23 PM Performed by: Ned Grace Pre-anesthesia Checklist: Patient identified, Patient being monitored, Emergency Drugs available, Timeout performed and Suction available Patient Re-evaluated:Patient Re-evaluated prior to inductionOxygen Delivery Method: Circle system utilized Preoxygenation: Pre-oxygenation with 100% oxygen Intubation Type: Rapid sequence Laryngoscope size: #4 Glidescope (lg adult size)  Grade View: Grade I Tube type: Oral Tube size: 7.5 mm Number of attempts: 1 Airway Equipment and Method: Rigid stylet and Video-laryngoscopy Placement Confirmation: ETT inserted through vocal cords under direct vision,  breath sounds checked- equal and bilateral and positive ETCO2 Secured at: 23 cm Tube secured with: Tape Dental Injury: Teeth and Oropharynx as per pre-operative assessment

## 2014-08-30 NOTE — Op Note (Signed)
Preop diagnosis: Spinal stenosis with central and lateral recess stenosis L2-3 and L3-4 Grade 1 spondylolisthesis Postop diagnosis: Same Procedure: Bilateral L2-3 and L3-4 decompressive laminectomy with decompression of central and lateral recess stenosis L2-3 and L3-4  posterior dynamic intralaminar stabilization Surgeon: Pinchos Topel Assistant: Vertell Limber  After being placed the prone position the patient's back was prepped and draped in the usual sterile fashion. Midline incision was made above the spinous processes of L2-L3 and L4. Using Bovie cutting current the incision was carried on the spinous processes. Subperiosteal dissection was then carried out bilaterally the spinous processes and lamina of L2-L3 and L4. Subcutaneous tract was placed for exposure and x-ray showed approach the appropriate levels. Starting L2-3 generous laminotomy was performed removing the inferior 80% of the L2 lamina the medial one third of the facet joint and the superior one third of the L3 lamina. Residual bone and ligamentum flavum removed in a piecemeal fashion. Similar decompression was then carried out on the opposite side. The space in the interspinous region was removed in the midline compression the lead is well. We then turned our attention to L3-4 were similar decompression was carried out. Once again the inferior one half of the L3 lamina the medial one third of the facet joint the superior one third of the L4 lamina were removed. Once again residual hypertrophic ligamentum flavum removed and generous midline decompression was carried out while the same time the spinous processes were spared. At this time we inspected all directions for any evidence of residual compression and none could be identified. Irrigation was carried out and any bleeding control proper coagulation Gelfoam. Measurements were taken and a 10 mm and 8 mm Coflex intralaminar device was chosen and the wings plate appropriately. We placed the device at  L2-3 first followed by the device L3-4 and and the wings at each level. AP and lateral fluoroscopy looked excellent. The was then irrigated copiously any bleeding control proper coagulation Gelfoam. Left an epidural drain in the epidural space and brought out through separate stab incision. The was then closed in multiple layers of Vicryl on the muscle fascia subcutaneous and subcuticular tissues. Running locking Prolene was placed on the skin. Shortness was then applied the patient was extubated and taken to recovery room in stable condition.

## 2014-08-30 NOTE — Transfer of Care (Signed)
Immediate Anesthesia Transfer of Care Note  Patient: Jon Whitney  Procedure(s) Performed: Procedure(s) with comments: Bilateral Lumbar Two to three, Lumbar Three to Four Laminectomy/Foraminotomy with coflex (Bilateral) - Bilateral L2-3 L3-4 Laminectomy/Foraminotomy with coflex  Patient Location: PACU  Anesthesia Type:General  Level of Consciousness: awake, alert , oriented and patient cooperative  Airway & Oxygen Therapy: Patient Spontanous Breathing and Patient connected to nasal cannula oxygen, removable palatal obturator replaced at case end.   Post-op Assessment: Report given to PACU RN, Post -op Vital signs reviewed and stable and Patient moving all extremities  Post vital signs: Reviewed and stable  Complications: No apparent anesthesia complications
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
## 2014-08-31 MED ORDER — PANTOPRAZOLE SODIUM 40 MG PO TBEC
40.0000 mg | DELAYED_RELEASE_TABLET | Freq: Every day | ORAL | Status: DC
  Administered 2014-08-31 – 2014-09-02 (×3): 40 mg via ORAL
  Filled 2014-08-31 (×3): qty 1

## 2014-08-31 MED ORDER — POLYETHYLENE GLYCOL 3350 17 G PO PACK
17.0000 g | PACK | Freq: Every day | ORAL | Status: DC
  Administered 2014-09-01 – 2014-09-02 (×2): 17 g via ORAL
  Filled 2014-08-31 (×2): qty 1

## 2014-08-31 NOTE — Progress Notes (Signed)
UR complete.  Eiza Canniff RN, MSN 

## 2014-08-31 NOTE — Progress Notes (Signed)
Patient ID: Jon Whitney, male   DOB: Feb 24, 1946, 68 y.o.   MRN: [id number] Afeb, vss No new neuro issues. Feels generally weak from surgery, but says his pre op pain issues are markedly better.  Will increase activity today, and hopefully home tomorrow.

## 2014-09-01 MED ORDER — BISACODYL 10 MG RE SUPP
10.0000 mg | Freq: Every day | RECTAL | Status: DC | PRN
  Administered 2014-09-01: 10 mg via RECTAL
  Filled 2014-09-01: qty 1

## 2014-09-01 NOTE — Evaluation (Addendum)
Occupational Therapy Evaluation Patient Details Name: Jon Whitney MRN: [id number] DOB: Nov 15, 1946 Today's Date: 09/01/2014    History of Present Illness 68 y.o. s/p Bilateral L2-3 L3-4 Laminectomy/Foraminotomy with coflex   Clinical Impression   Pt s/p above. Spouse assisting with ADLs, PTA. Feel pt will benefit from acute OT to increase independence prior to d/c.    Follow Up Recommendations  No OT follow up;Supervision - Intermittent    Equipment Recommendations  Other (comment) (bariatric 3 in 1)    Recommendations for Other Services       Precautions / Restrictions Precautions Precautions: Back Precaution Booklet Issued: Yes (comment) Precaution Comments: Educated on back precautions Restrictions Weight Bearing Restrictions: No      Mobility Bed Mobility Overal bed mobility: Needs Assistance Bed Mobility: Rolling;Sidelying to Sit Rolling: Supervision Sidelying to sit: Supervision       General bed mobility comments: cues for technique  Transfers Overall transfer level: Needs assistance Equipment used: Rolling walker (2 wheeled) Transfers: Sit to/from Stand Sit to Stand: Supervision         General transfer comment: cues for hand placement    Balance                                            ADL Overall ADL's : Needs assistance/impaired                     Lower Body Dressing: Minimal assistance;With adaptive equipment;Sit to/from stand   Toilet Transfer: Ambulation;RW;Supervision/safety;Minimal assistance   Toileting- Clothing Manipulation and Hygiene: Minimal assistance;Sit to/from stand       Functional mobility during ADLs: Supervision/safety;Minimal assistance;Rolling walker (tried to get pt to walk with walker further out in front-resistant) General ADL Comments: Educated on AE for LB ADLs. Educated on safety tips for home (safe shoewear, sitting for LB ADLs, rugs/clutter). Educated on toilet aide for  hygiene.  Educated on 3 in 1.     Vision                     Perception     Praxis      Pertinent Vitals/Pain Pain Assessment: No/denies pain     Hand Dominance Right   Extremity/Trunk Assessment Upper Extremity Assessment Upper Extremity Assessment: Overall WFL for tasks assessed   Lower Extremity Assessment Lower Extremity Assessment: Defer to PT evaluation       Communication Communication Communication: No difficulties   Cognition Arousal/Alertness: Awake/alert Behavior During Therapy: WFL for tasks assessed/performed Overall Cognitive Status: Within Functional Limits for tasks assessed                     General Comments       Exercises       Shoulder Instructions      Home Living Family/patient expects to be discharged to:: Private residence Living Arrangements: Spouse/significant other Available Help at Discharge: Family (spouse can't physically assist pt) Type of Home: House Home Access: Stairs to enter [employer] of Steps: 10 Entrance Stairs-Rails: Right;Left Home Layout: One level     Bathroom Shower/Tub: Tub/shower unit;Walk-in shower   Bathroom Toilet: Standard     Home Equipment: Environmental consultant - 4 wheels;Cane - single point;Shower seat;Grab bars - toilet;Grab bars - tub/shower          Prior Functioning/Environment Level of Independence: Needs assistance    ADL's /  Homemaking Assistance Needed: assistance with dressing        OT Diagnosis: Acute pain   OT Problem List: Decreased range of motion;Decreased knowledge of use of DME or AE;Decreased knowledge of precautions;Pain   OT Treatment/Interventions: Self-care/ADL training;DME and/or AE instruction;Therapeutic activities;Patient/family education;Balance training    OT Goals(Current goals can be found in the care plan section) Acute Rehab OT Goals Patient Stated Goal: not stated OT Goal Formulation: With patient Time For Goal Achievement:  09/08/14 Potential to Achieve Goals: Good ADL Goals Pt Will Perform Lower Body Dressing: with modified independence;with adaptive equipment;sit to/from stand Pt Will Transfer to Toilet: with modified independence;ambulating Pt Will Perform Toileting - Clothing Manipulation and hygiene: with modified independence;sit to/from stand;with adaptive equipment  OT Frequency: Min 2X/week   Barriers to D/C:            Co-evaluation              End of Session Equipment Utilized During Treatment: Gait belt;Rolling walker  Activity Tolerance: Patient tolerated treatment well Patient left: in bed;with call bell/phone within reach;with family/visitor present   Time: 1131-1201 OT Time Calculation (min): 30 min Charges:  OT General Charges $OT Visit: 1 Procedure OT Evaluation $Initial OT Evaluation Tier I: 1 Procedure OT Treatments $Self Care/Home Management : 8-22 mins G-CodesBenito Mccreedy OTR/L [phone] 09/01/2014, 1:31 PM

## 2014-09-01 NOTE — Progress Notes (Signed)
Subjective: Patient reports doing better progress improvement but having some hesitancy voiding not convinced she's emptying his bladder completely has not had a bowel movement  Objective: Vital signs in last 24 hours: Temp:  [97.8 F (36.6 C)-98.9 F (37.2 C)] 98.5 F (36.9 C) (09/26 0556) Pulse Rate:  [65-85] 76 (09/26 0556) Resp:  [18-22] 20 (09/26 0556) BP: (142-179)/(59-79) 153/65 mmHg (09/26 0556) SpO2:  [99 %-100 %] 100 % (09/26 0556)  Intake/Output from previous day: 09/25 0701 - 09/26 0700 In: 1000 [IV Piggyback:1000] Out: 1080 [Urine:850; Drains:230] Intake/Output this shift:    neurologically stable wound clean dry and intact  Lab Results: No results found for this basename: WBC, HGB, HCT, PLT,  in the last 72 hours BMET No results found for this basename: NA, K, CL, CO2, GLUCOSE, BUN, CREATININE, CALCIUM,  in the last 72 hours  Studies/Results: Dg Lumbar Spine 2-3 Views  08/30/2014   CLINICAL DATA:  Bilateral L2-L4 laminectomy. History lumbar stenosis.  EXAM: DG C-ARM 61-120 MIN; LUMBAR SPINE - 2-3 VIEW  COMPARISON:  CT 07/30/2014  FINDINGS: AP and lateral views are submitted. Postsurgical hardware is demonstrated projecting over the posterior aspect of the lumbar spine. The visualized lumbar spine is in normal anatomic alignment.  IMPRESSION: Surgical hardware projecting over the dorsum of the spine.   Electronically Signed   By: Lovey Newcomer M.D.   On: 08/30/2014 16:42   Dg C-arm 1-60 Min  08/30/2014   CLINICAL DATA:  Bilateral L2-L4 laminectomy. History lumbar stenosis.  EXAM: DG C-ARM 61-120 MIN; LUMBAR SPINE - 2-3 VIEW  COMPARISON:  CT 07/30/2014  FINDINGS: AP and lateral views are submitted. Postsurgical hardware is demonstrated projecting over the posterior aspect of the lumbar spine. The visualized lumbar spine is in normal anatomic alignment.  IMPRESSION: Surgical hardware projecting over the dorsum of the spine.   Electronically Signed   By: Lovey Newcomer M.D.    On: 08/30/2014 16:42    Assessment/Plan: Will check a post void residual we'll also initiate physical and occupational therapy also start bowel regimen Hemovac still is putting out too much output 2 discontinue.   LOS: 2 days     Casen Pryor P 09/01/2014, 8:48 AM

## 2014-09-01 NOTE — Progress Notes (Signed)
Patient has home CPAP unit which he self administers.  Patient was instructed to contact Respiratory with any issues.

## 2014-09-02 LAB — BASIC METABOLIC PANEL
ANION GAP: 10 (ref 5–15)
BUN: 11 mg/dL (ref 6–23)
CO2: 26 mEq/L (ref 19–32)
Calcium: 8.5 mg/dL (ref 8.4–10.5)
Chloride: 104 mEq/L (ref 96–112)
Creatinine, Ser: 1.18 mg/dL (ref 0.50–1.35)
GFR, EST AFRICAN AMERICAN: 71 mL/min — AB (ref >90)
GFR, EST NON AFRICAN AMERICAN: 62 mL/min — AB (ref >90)
Glucose, Bld: 99 mg/dL (ref 70–99)
POTASSIUM: 4.5 meq/L (ref 3.7–5.3)
Sodium: 140 mEq/L (ref 137–147)

## 2014-09-02 NOTE — Progress Notes (Signed)
ANTIBIOTIC CONSULT NOTE - FOLLOW UP  Pharmacy Consult for Vancomycin Indication: s/p spinal surgery  Allergies  Allergen Reactions  . Penicillins Hives and Rash    Patient Measurements: Height: 5\' 11"  (180.3 cm) Weight: 309 lb 9.6 oz (140.434 kg) IBW/kg (Calculated) : 75.3  Vital Signs: Temp: 97.8 F (36.6 C) (09/27 0545) Temp src: Oral (09/27 0545) BP: 147/79 mmHg (09/27 1001) Pulse Rate: 66 (09/27 0545) Intake/Output from previous day: 09/26 0701 - 09/27 0700 In: 1040 [P.O.:1040] Out: 975 [Urine:900; Drains:75] Intake/Output from this shift: Total I/O In: 563 [P.O.:560; I.V.:3] Out: -   Labs:  Recent Labs  09/02/14 0622  CREATININE 1.18   Estimated Creatinine Clearance: 85.8 ml/min (by C-G formula based on Cr of 1.18).  Assessment: 68yom s/p spinal surgery on 9/24 was continued on vancomycin for post-op prophylaxis. Patient still with drain. BMET check today and renal function is stable. Noted plan for possible discharge tomorrow.  9/25 Vancomycin>> No cultures   Goal of Therapy:  Vancomycin trough level 10-15 mcg/ml  Plan:  1) Continue vancomycin 1.5g IV q12 2) If patient here past tomorrow will obtain level  Deboraha Sprang 09/02/2014,1:15 PM

## 2014-09-02 NOTE — Evaluation (Signed)
Physical Therapy Evaluation Patient Details Name: Jon Whitney MRN: [id number] DOB: 1946-08-20 Today's Date: 09/02/2014   History of Present Illness  68 y.o. s/p Bilateral L2-3 L3-4 Laminectomy/Foraminotomy with coflex  Clinical Impression  Patient is s/p above surgery resulting in the deficits listed below (see PT Problem List).  Patient will benefit from skilled PT to increase their independence and safety with mobility (while adhering to their precautions) to allow discharge home with wife. Patient needs to practice stairs next session prior to D/C home.     Follow Up Recommendations Home health PT;Supervision/Assistance - 24 hour (may progress and not needHHPT; pt requesting)    Equipment Recommendations  Rolling walker with 5" wheels    Recommendations for Other Services       Precautions / Restrictions Precautions Precautions: Back Precaution Booklet Issued: Yes (comment) Precaution Comments: reviewed back precautions with pt and wife Restrictions Weight Bearing Restrictions: No      Mobility  Bed Mobility Overal bed mobility: Needs Assistance Bed Mobility: Sidelying to Sit   Sidelying to sit: Supervision       General bed mobility comments: pt with incr difficulty due to weakness and pain; cues for technique   Transfers Overall transfer level: Needs assistance Equipment used: Rolling walker (2 wheeled) Transfers: Sit to/from Stand Sit to Stand: Supervision         General transfer comment: cues for hand placement and safety; encouraged to reach back for chair and control descent to chair   Ambulation/Gait Ambulation/Gait assistance: Min guard Ambulation Distance (Feet): 150 Feet Assistive device: Rolling walker (2 wheeled) Gait Pattern/deviations: Step-through pattern;Decreased stride length;Trunk flexed;Wide base of support Gait velocity: guarded due to pain Gait velocity interpretation: Below normal speed for age/gender General Gait Details:  multimodal cues for upright posture; min guard to steady; cues for safety with RW  Stairs            Wheelchair Mobility    Modified Rankin (Stroke Patients Only)       Balance Overall balance assessment: Needs assistance Sitting-balance support: Feet supported;No upper extremity supported Sitting balance-Leahy Scale: Fair     Standing balance support: During functional activity;Bilateral upper extremity supported Standing balance-Leahy Scale: Poor Standing balance comment: relies heavily on RW for balance                             Pertinent Vitals/Pain Pain Assessment: 0-10 Pain Score: 6  Pain Location: surgical site Pain Descriptors / Indicators: Operative site guarding;Dull Pain Intervention(s): Limited activity within patient's tolerance;Premedicated before session;Patient requesting pain meds-RN notified    Home Living Family/patient expects to be discharged to:: Private residence Living Arrangements: Spouse/significant other Available Help at Discharge: Family Type of Home: House Home Access: Stairs to enter Entrance Stairs-Rails: Right Entrance Stairs-Number of Steps: 3-4 Home Layout: One level Home Equipment: [place] - 4 wheels;Cane - single point;Shower seat;Grab bars - toilet;Grab bars - tub/shower Additional Comments: pt reports he can go up side steps ; pt with difficulty fitting rollator in house    Prior Function Level of Independence: Needs assistance   Gait / Transfers Assistance Needed: ambulate with rollator  ADL's / Homemaking Assistance Needed: assistance with dressing        Hand Dominance   Dominant Hand: Right    Extremity/Trunk Assessment   Upper Extremity Assessment: Defer to OT evaluation           Lower Extremity Assessment: Generalized weakness  Cervical / Trunk Assessment: Normal  Communication   Communication: No difficulties  Cognition Arousal/Alertness: Awake/alert Behavior During Therapy: WFL  for tasks assessed/performed Overall Cognitive Status: Within Functional Limits for tasks assessed                      General Comments      Exercises        Assessment/Plan    PT Assessment Patient needs continued PT services  PT Diagnosis Abnormality of gait;Generalized weakness;Acute pain   PT Problem List Decreased strength;Decreased activity tolerance;Decreased balance;Decreased mobility;Decreased knowledge of use of DME;Decreased safety awareness;Decreased knowledge of precautions;Obesity;Pain  PT Treatment Interventions DME instruction;Gait training;Stair training;Functional mobility training;Therapeutic activities;Therapeutic exercise;Balance training;Neuromuscular re-education;Patient/family education   PT Goals (Current goals can be found in the Care Plan section) Acute Rehab PT Goals Patient Stated Goal: to get my muscles stronger PT Goal Formulation: With patient Time For Goal Achievement: 09/09/14 Potential to Achieve Goals: Good    Frequency Min 5X/week   Barriers to discharge        Co-evaluation               End of Session Equipment Utilized During Treatment: Gait belt Activity Tolerance: Patient tolerated treatment well Patient left: in chair;with call bell/phone within reach;with family/visitor present Nurse Communication: Mobility status         Time: 9509-3267 PT Time Calculation (min): 15 min   Charges:   PT Evaluation $Initial PT Evaluation Tier I: 1 Procedure PT Treatments $Gait Training: 8-22 mins   PT G CodesGustavus Bryant , [place]  09/02/2014, 11:21 AM

## 2014-09-02 NOTE — Progress Notes (Signed)
Patient ID: Jon Whitney, male   DOB: [birth year], 68 y.o.   MRN: [id number] Incision is clean and dry, patient still reports moderate pain. He is been ambulatory but minimally so. Encourage patient to mobilize more May be ready for discharge in a.m.

## 2014-09-03 MED ORDER — OXYCODONE HCL 10 MG PO TABS: 10.0000 mg | ORAL_TABLET | ORAL | Status: DC | PRN

## 2014-09-03 NOTE — Discharge Summary (Signed)
Physician Discharge Summary  Patient ID: Jon Whitney MRN: [id number] DOB/AGE: 06-17-1946 68 y.o.  Admit date: 08/30/2014 Discharge date: 09/03/2014  Admission Diagnoses:  Discharge Diagnoses:  Active Problems:   Stenosis, spinal, lumbar   Discharged Condition: good  Hospital Course: Surgery for lumbar stenosis. Did well. Slow to increase activity. By pod 3, ambulating well. Wound fine. Pain controlled. Home with specific instructions.  Consults: None  Significant Diagnostic Studies: none  Treatments: surgery: L 23 L 34 decompresiion with coflex  Discharge Exam: Blood pressure 150/60, pulse 72, temperature 98.6 F (37 C), temperature source Oral, resp. rate 18, height 5\' 11"  (1.803 m), weight 140.434 kg (309 lb 9.6 oz), SpO2 100.00%. Incision/Wound:clean and dry; no new neuro issues  Disposition: 01-Home or Self Care     Medication List    ASK your doctor about these medications       albuterol 108 (90 BASE) MCG/ACT inhaler  Commonly known as:  PROVENTIL HFA;VENTOLIN HFA  Inhale 1 puff into the lungs every 6 (six) hours as needed for wheezing or shortness of breath.     ALPRAZolam 0.5 MG tablet  Commonly known as:  XANAX  Take 0.5 mg by mouth 3 (three) times daily.     amLODipine 10 MG tablet  Commonly known as:  NORVASC  Take 10 mg by mouth daily.     diclofenac 75 MG EC tablet  Commonly known as:  VOLTAREN  Take 75 mg by mouth 2 (two) times daily.     docusate sodium 100 MG capsule  Commonly known as:  COLACE  Take 200 mg by mouth daily.     fluticasone 50 MCG/ACT nasal spray  Commonly known as:  FLONASE  Place 2 sprays into the nose daily.     HYDROcodone-acetaminophen 5-325 MG per tablet  Commonly known as:  NORCO/VICODIN  Take 2 tablets by mouth every 8 (eight) hours as needed (pain).     hydrOXYzine 25 MG tablet  Commonly known as:  ATARAX/VISTARIL  Take 50 mg by mouth 3 (three) times daily.     indomethacin 50 MG capsule  Commonly  known as:  INDOCIN  Take 50 mg by mouth daily.     methocarbamol 500 MG tablet  Commonly known as:  ROBAXIN  Take 500 mg by mouth 3 (three) times daily as needed for muscle spasms.     multivitamin with minerals Tabs tablet  Take 1 tablet by mouth daily.     omeprazole 20 MG capsule  Commonly known as:  PRILOSEC  Take 20 mg by mouth 3 (three) times daily before meals.     ondansetron 8 MG disintegrating tablet  Commonly known as:  ZOFRAN ODT  Take 1 tablet (8 mg total) by mouth every 8 (eight) hours as needed for nausea or vomiting.     pravastatin 40 MG tablet  Commonly known as:  PRAVACHOL  Take 20 mg by mouth at bedtime.     sildenafil 100 MG tablet  Commonly known as:  VIAGRA  Take 100 mg by mouth daily as needed for erectile dysfunction. For intercourse         At home rest most of the time. Get up 9 or 10 times each day and take a 15 or 20 minute walk. No riding in the car and to your first postoperative appointment. If you have neck surgery you may shower from the chest down starting on the third postoperative day. If you had back surgery he may start showering on  the third postoperative day with saran wrap wrapped around your incisional area 3 times. After the shower remove the saran wrap. Take pain medicine as needed and other medications as instructed. Call my office for an appointment.  SignedFaythe Ghee, MD 09/03/2014, 7:44 AM

## 2014-09-03 NOTE — Progress Notes (Signed)
Physical Therapy Treatment Patient Details Name: Jon Whitney MRN: [id number] DOB: Mar 09, 1946 Today's Date: 09/03/2014    History of Present Illness 68 y.o. s/p Bilateral L2-3 L3-4 Laminectomy/Foraminotomy with coflex    PT Comments    Pt mobilizing at supervision level at this time. Reviewed back precautions throughout session. Pt safe to D/C home when RW delivered.   Follow Up Recommendations  Supervision/Assistance - 24 hour;No PT follow up     Equipment Recommendations  Rolling walker with 5" wheels    Recommendations for Other Services       Precautions / Restrictions Precautions Precautions: Back Precaution Comments: reviewed precautions Restrictions Weight Bearing Restrictions: No    Mobility  Bed Mobility               General bed mobility comments: pt sitting on side of bed and returned to side of bed  Transfers Overall transfer level: Needs assistance Equipment used: Rolling walker (2 wheeled) Transfers: Sit to/from Stand Sit to Stand: Supervision         General transfer comment: min cues for hand placement  Ambulation/Gait Ambulation/Gait assistance: Supervision Ambulation Distance (Feet): 300 Feet (150 x 2 ) Assistive device: Rolling walker (2 wheeled) Gait Pattern/deviations: Step-through pattern;Decreased stride length;Shuffle;Wide base of support Gait velocity: guarded due to pain Gait velocity interpretation: Below normal speed for age/gender General Gait Details: supervision for safety and min cues for RW management    Stairs Stairs: Yes Stairs assistance: Supervision Stair Management: Two rails;Step to pattern;Forwards Number of Stairs: 2 General stair comments: cues for sequencing and safe management; no LOB noted  Wheelchair Mobility    Modified Rankin (Stroke Patients Only)       Balance Overall balance assessment: Needs assistance Sitting-balance support: Feet supported;No upper extremity supported Sitting  balance-Leahy Scale: Good     Standing balance support: During functional activity;Bilateral upper extremity supported Standing balance-Leahy Scale: Poor Standing balance comment: relies heavily on RW                     Cognition Arousal/Alertness: Awake/alert Behavior During Therapy: WFL for tasks assessed/performed Overall Cognitive Status: Within Functional Limits for tasks assessed       Memory: Decreased recall of precautions              Exercises      General Comments        Pertinent Vitals/Pain Pain Assessment: 0-10 Pain Score: 6  Pain Location: surgical site Pain Descriptors / Indicators: Dull;Aching Pain Intervention(s): Repositioned;Premedicated before session;Monitored during session    Home Living                      Prior Function            PT Goals (current goals can now be found in the care plan section) Acute Rehab PT Goals Patient Stated Goal: to get my muscles stronger PT Goal Formulation: With patient Time For Goal Achievement: 09/09/14 Potential to Achieve Goals: Good Progress towards PT goals: Progressing toward goals    Frequency  Min 5X/week    PT Plan Current plan remains appropriate    Co-evaluation             End of Session Equipment Utilized During Treatment: Gait belt Activity Tolerance: Patient tolerated treatment well Patient left: in bed;with call bell/phone within reach;with family/visitor present     Time: 0822-0841 PT Time Calculation (min): 19 min  Charges:  $Gait Training: 8-22 mins  G CodesElie Confer [place], [place] 09/03/2014, 9:12 AM

## 2014-09-03 NOTE — Progress Notes (Signed)
Patient ready for discharge home; discharge instructions given and reviewed with patient and his wife; Rx for pain given to patient; rolling walker delivered for home use; patient discharged home accompanied by his wife.

## 2014-09-03 NOTE — Care Management Note (Signed)
    Page 1 of 1   09/03/2014     11:02:07 AM CARE MANAGEMENT NOTE 09/03/2014  Patient:  Jon Whitney, Jon Whitney   Account Number:  [id number]  Date Initiated:  08/31/2014  Documentation initiated by:  Lorne Skeens  Subjective/Objective Assessment:   Patient was admitted for bilateral L2-4 laminectomy/foramenotomy.     Action/Plan:   Will follow for discharge needs pending PT/OT evals and physician orders   Anticipated DC Date:     Anticipated DC Plan:  HOME/SELF CARE         Choice offered to / List presented to:     DME arranged  Vassie Moselle      DME agency  [facility].        Status of service:  In process, will continue to follow Medicare Important Message given?  YES (If response is "NO", the following Medicare IM given date fields will be blank) Date Medicare IM given:  09/03/2014 Medicare IM given by:  Lorne Skeens Date Additional Medicare IM given:   Additional Medicare IM given by:    Discharge Disposition:  HOME/SELF CARE  Per UR Regulation:  Reviewed for med. necessity/level of care/duration of stay  If discussed at [place] of Stay Meetings, dates discussed:    Comments:  09/03/14 [place], MSN, CM- Medicare IM letter provided. [facility] notified of need for rolling walker for discharge home today.

## 2014-12-13 DIAGNOSIS — M25562 Pain in left knee: Secondary | ICD-10-CM | POA: Diagnosis not present

## 2014-12-13 DIAGNOSIS — I10 Essential (primary) hypertension: Secondary | ICD-10-CM | POA: Diagnosis not present

## 2014-12-27 DIAGNOSIS — M25512 Pain in left shoulder: Secondary | ICD-10-CM | POA: Diagnosis not present

## 2014-12-27 DIAGNOSIS — I10 Essential (primary) hypertension: Secondary | ICD-10-CM | POA: Diagnosis not present

## 2014-12-31 DIAGNOSIS — E291 Testicular hypofunction: Secondary | ICD-10-CM | POA: Diagnosis not present

## 2015-01-22 DIAGNOSIS — M25562 Pain in left knee: Secondary | ICD-10-CM | POA: Diagnosis not present

## 2015-01-22 DIAGNOSIS — E291 Testicular hypofunction: Secondary | ICD-10-CM | POA: Diagnosis not present

## 2015-01-22 DIAGNOSIS — I10 Essential (primary) hypertension: Secondary | ICD-10-CM | POA: Diagnosis not present

## 2015-01-28 DIAGNOSIS — J069 Acute upper respiratory infection, unspecified: Secondary | ICD-10-CM | POA: Diagnosis not present

## 2015-01-28 DIAGNOSIS — J029 Acute pharyngitis, unspecified: Secondary | ICD-10-CM | POA: Diagnosis not present

## 2015-01-28 DIAGNOSIS — Z6841 Body Mass Index (BMI) 40.0 and over, adult: Secondary | ICD-10-CM | POA: Diagnosis not present

## 2015-02-15 DIAGNOSIS — E291 Testicular hypofunction: Secondary | ICD-10-CM | POA: Diagnosis not present

## 2015-02-19 DIAGNOSIS — Z6841 Body Mass Index (BMI) 40.0 and over, adult: Secondary | ICD-10-CM | POA: Diagnosis not present

## 2015-02-19 DIAGNOSIS — M25562 Pain in left knee: Secondary | ICD-10-CM | POA: Diagnosis not present

## 2015-02-19 DIAGNOSIS — I10 Essential (primary) hypertension: Secondary | ICD-10-CM | POA: Diagnosis not present

## 2015-03-12 DIAGNOSIS — E291 Testicular hypofunction: Secondary | ICD-10-CM | POA: Diagnosis not present

## 2015-03-26 DIAGNOSIS — I10 Essential (primary) hypertension: Secondary | ICD-10-CM | POA: Diagnosis not present

## 2015-03-26 DIAGNOSIS — M25562 Pain in left knee: Secondary | ICD-10-CM | POA: Diagnosis not present

## 2015-03-26 DIAGNOSIS — Z6841 Body Mass Index (BMI) 40.0 and over, adult: Secondary | ICD-10-CM | POA: Diagnosis not present

## 2015-03-26 DIAGNOSIS — M179 Osteoarthritis of knee, unspecified: Secondary | ICD-10-CM | POA: Diagnosis not present

## 2015-04-01 DIAGNOSIS — E291 Testicular hypofunction: Secondary | ICD-10-CM | POA: Diagnosis not present

## 2015-04-25 DIAGNOSIS — E291 Testicular hypofunction: Secondary | ICD-10-CM | POA: Diagnosis not present

## 2015-05-07 DIAGNOSIS — M25562 Pain in left knee: Secondary | ICD-10-CM | POA: Diagnosis not present

## 2015-05-07 DIAGNOSIS — Z6841 Body Mass Index (BMI) 40.0 and over, adult: Secondary | ICD-10-CM | POA: Diagnosis not present

## 2015-05-07 DIAGNOSIS — I10 Essential (primary) hypertension: Secondary | ICD-10-CM | POA: Diagnosis not present

## 2015-05-16 DIAGNOSIS — Z1389 Encounter for screening for other disorder: Secondary | ICD-10-CM | POA: Diagnosis not present

## 2015-05-16 DIAGNOSIS — Z6841 Body Mass Index (BMI) 40.0 and over, adult: Secondary | ICD-10-CM | POA: Diagnosis not present

## 2015-05-16 DIAGNOSIS — Z0000 Encounter for general adult medical examination without abnormal findings: Secondary | ICD-10-CM | POA: Diagnosis not present

## 2015-05-24 DIAGNOSIS — E291 Testicular hypofunction: Secondary | ICD-10-CM | POA: Diagnosis not present

## 2015-06-18 DIAGNOSIS — Z6841 Body Mass Index (BMI) 40.0 and over, adult: Secondary | ICD-10-CM | POA: Diagnosis not present

## 2015-06-18 DIAGNOSIS — I10 Essential (primary) hypertension: Secondary | ICD-10-CM | POA: Diagnosis not present

## 2015-06-18 DIAGNOSIS — M79642 Pain in left hand: Secondary | ICD-10-CM | POA: Diagnosis not present

## 2015-06-18 DIAGNOSIS — M25512 Pain in left shoulder: Secondary | ICD-10-CM | POA: Diagnosis not present

## 2015-07-05 DIAGNOSIS — E291 Testicular hypofunction: Secondary | ICD-10-CM | POA: Diagnosis not present

## 2015-07-16 DIAGNOSIS — M25562 Pain in left knee: Secondary | ICD-10-CM | POA: Diagnosis not present

## 2015-07-16 DIAGNOSIS — Z6841 Body Mass Index (BMI) 40.0 and over, adult: Secondary | ICD-10-CM | POA: Diagnosis not present

## 2015-07-16 DIAGNOSIS — Z1389 Encounter for screening for other disorder: Secondary | ICD-10-CM | POA: Diagnosis not present

## 2015-07-16 DIAGNOSIS — F348 Other persistent mood [affective] disorders: Secondary | ICD-10-CM | POA: Diagnosis not present

## 2015-07-16 DIAGNOSIS — I10 Essential (primary) hypertension: Secondary | ICD-10-CM | POA: Diagnosis not present

## 2015-07-31 DIAGNOSIS — E291 Testicular hypofunction: Secondary | ICD-10-CM | POA: Diagnosis not present

## 2015-08-15 DIAGNOSIS — I10 Essential (primary) hypertension: Secondary | ICD-10-CM | POA: Diagnosis not present

## 2015-08-15 DIAGNOSIS — M25562 Pain in left knee: Secondary | ICD-10-CM | POA: Diagnosis not present

## 2015-08-15 DIAGNOSIS — Z6841 Body Mass Index (BMI) 40.0 and over, adult: Secondary | ICD-10-CM | POA: Diagnosis not present

## 2015-09-02 DIAGNOSIS — E291 Testicular hypofunction: Secondary | ICD-10-CM | POA: Diagnosis not present

## 2015-09-19 DIAGNOSIS — Z6841 Body Mass Index (BMI) 40.0 and over, adult: Secondary | ICD-10-CM | POA: Diagnosis not present

## 2015-09-19 DIAGNOSIS — M25562 Pain in left knee: Secondary | ICD-10-CM | POA: Diagnosis not present

## 2015-09-19 DIAGNOSIS — I10 Essential (primary) hypertension: Secondary | ICD-10-CM | POA: Diagnosis not present

## 2015-09-30 DIAGNOSIS — E291 Testicular hypofunction: Secondary | ICD-10-CM | POA: Diagnosis not present

## 2015-09-30 DIAGNOSIS — Z23 Encounter for immunization: Secondary | ICD-10-CM | POA: Diagnosis not present

## 2015-09-30 DIAGNOSIS — L578 Other skin changes due to chronic exposure to nonionizing radiation: Secondary | ICD-10-CM | POA: Diagnosis not present

## 2015-09-30 DIAGNOSIS — L638 Other alopecia areata: Secondary | ICD-10-CM | POA: Diagnosis not present

## 2015-10-24 DIAGNOSIS — M25562 Pain in left knee: Secondary | ICD-10-CM | POA: Diagnosis not present

## 2015-10-24 DIAGNOSIS — Z6841 Body Mass Index (BMI) 40.0 and over, adult: Secondary | ICD-10-CM | POA: Diagnosis not present

## 2015-10-24 DIAGNOSIS — I10 Essential (primary) hypertension: Secondary | ICD-10-CM | POA: Diagnosis not present

## 2015-11-07 DIAGNOSIS — Z1389 Encounter for screening for other disorder: Secondary | ICD-10-CM | POA: Diagnosis not present

## 2015-11-07 DIAGNOSIS — E291 Testicular hypofunction: Secondary | ICD-10-CM | POA: Diagnosis not present

## 2015-11-07 DIAGNOSIS — Z6841 Body Mass Index (BMI) 40.0 and over, adult: Secondary | ICD-10-CM | POA: Diagnosis not present

## 2015-11-22 DIAGNOSIS — E291 Testicular hypofunction: Secondary | ICD-10-CM | POA: Diagnosis not present

## 2015-12-06 DIAGNOSIS — E291 Testicular hypofunction: Secondary | ICD-10-CM | POA: Diagnosis not present

## 2015-12-19 DIAGNOSIS — M65332 Trigger finger, left middle finger: Secondary | ICD-10-CM | POA: Diagnosis not present

## 2015-12-19 DIAGNOSIS — M25562 Pain in left knee: Secondary | ICD-10-CM | POA: Diagnosis not present

## 2015-12-19 DIAGNOSIS — I10 Essential (primary) hypertension: Secondary | ICD-10-CM | POA: Diagnosis not present

## 2016-01-01 DIAGNOSIS — E291 Testicular hypofunction: Secondary | ICD-10-CM | POA: Diagnosis not present

## 2016-01-23 DIAGNOSIS — E291 Testicular hypofunction: Secondary | ICD-10-CM | POA: Diagnosis not present

## 2016-01-30 ENCOUNTER — Ambulatory Visit: Payer: TRICARE For Life (TFL) | Admitting: Orthopaedic Surgery

## 2016-02-06 ENCOUNTER — Ambulatory Visit: Payer: TRICARE For Life (TFL) | Admitting: Orthopaedic Surgery

## 2016-02-10 DIAGNOSIS — F419 Anxiety disorder, unspecified: Secondary | ICD-10-CM | POA: Diagnosis not present

## 2016-02-10 DIAGNOSIS — Z1389 Encounter for screening for other disorder: Secondary | ICD-10-CM | POA: Diagnosis not present

## 2016-02-10 DIAGNOSIS — E291 Testicular hypofunction: Secondary | ICD-10-CM | POA: Diagnosis not present

## 2016-02-10 DIAGNOSIS — Z6841 Body Mass Index (BMI) 40.0 and over, adult: Secondary | ICD-10-CM | POA: Diagnosis not present

## 2016-02-11 ENCOUNTER — Ambulatory Visit (INDEPENDENT_AMBULATORY_CARE_PROVIDER_SITE_OTHER): Payer: Medicare Other | Admitting: Orthopaedic Surgery

## 2016-02-11 VITALS — BP 135/71 | HR 82 | Temp 98.1°F | Ht 70.0 in | Wt 323.6 lb

## 2016-02-11 DIAGNOSIS — M25562 Pain in left knee: Secondary | ICD-10-CM

## 2016-02-11 NOTE — Progress Notes (Addendum)
Patient [id number] Jon Whitney, male DOB:08-07-1946, 70 y.o. [id number]  Chief Complaint  Patient presents with  . Follow-up    Left knee pain    HPI  MAESON REGE is a 70 y.o. male who has chronic pain of the left knee.  It has been hurting more over the last several weeks.  He has no new trauma.  He has swelling and popping.  He has no giving way.  Knee Pain  The pain is present in the left knee. The quality of the pain is described as aching. The pain is at a severity of 4/10. The pain is moderate. The pain has been worsening since onset. Associated symptoms include an inability to bear weight and a loss of motion. Pertinent negatives include no numbness or tingling. He has tried elevation, immobilization, ice, non-weight bearing and rest for the symptoms. The treatment provided mild relief.    Body mass index is 46.43 kg/(m^2).   Review of Systems  Constitutional:       Patient does not have Diabetes Mellitus. Patient has hypertension. Patient has COPD or shortness of breath. Patient has BMI > 35. Patient does not have current smoking history.  HENT: Negative for congestion.   Respiratory: Positive for cough, shortness of breath and wheezing.   Cardiovascular: Negative for chest pain.  Gastrointestinal:       GERD  Endocrine: Positive for cold intolerance.  Musculoskeletal: Positive for myalgias, joint swelling, arthralgias and gait problem.  Allergic/Immunologic: Positive for environmental allergies.  Neurological: Negative for tingling and numbness.  Psychiatric/Behavioral: The patient is nervous/anxious.     Past Medical History  Diagnosis Date  . Psychiatric disorder   . Hypertension   . Asthma   . PTSD (post-traumatic stress disorder)   . Arthritis   . Sleep apnea     uses cpap  . Heart murmur     as a child "it went away"  . Anxiety   . GERD (gastroesophageal reflux disease)   . H/O hiatal hernia   . 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 Hand surgery Right     finger amputations  . Foot surgery Right   . Mouth surgery      prosthetic soft palate  . Colonoscopy      Family History  Problem Relation Age of Onset  . Asthma Mother   . Pneumonia Mother   . Bone cancer Father     Social History Social History  Substance Use Topics  . Smoking status: Former Smoker    Quit date: 08/28/1967  . Smokeless tobacco: Never Used  . Alcohol Use: Yes     Comment: occasionally    Allergies  Allergen Reactions  . Penicillins Hives and Rash    Current Outpatient Prescriptions  Medication Sig Dispense Refill  . albuterol (PROVENTIL HFA;VENTOLIN HFA) 108 (90 BASE) MCG/ACT inhaler Inhale 1 puff into the lungs every 6 (six) hours as needed for wheezing or shortness of breath.    . ALPRAZolam (XANAX) 0.5 MG tablet Take 0.5 mg by mouth 3 (three) times daily.    Marland Kitchen amLODipine (NORVASC) 10 MG tablet Take 10 mg by mouth daily.    . cetirizine (ZYRTEC) 10 MG tablet Take 10 mg by  mouth daily.    Marland Kitchen docusate sodium (COLACE) 100 MG capsule Take 200 mg by mouth daily.    . fluticasone (FLONASE) 50 MCG/ACT nasal spray Place 2 sprays into the nose daily.    . hydrOXYzine (ATARAX/VISTARIL) 25 MG tablet Take 50 mg by mouth 3 (three) times daily.    . methocarbamol (ROBAXIN) 500 MG tablet Take 500 mg by mouth 3 (three) times daily as needed for muscle spasms.    . Multiple Vitamin (MULTIVITAMIN WITH MINERALS) TABS tablet Take 1 tablet by mouth daily.    Marland Kitchen omeprazole (PRILOSEC) 20 MG capsule Take 20 mg by mouth 3 (three) times daily before meals.    . ondansetron (ZOFRAN ODT) 8 MG disintegrating tablet Take 1 tablet (8 mg total) by mouth every 8 (eight) hours as needed for nausea or vomiting. 10  tablet 0  . Oxycodone HCl (ROXICODONE) 10 MG TABS Take 1 tablet (10 mg total) by mouth every 4 (four) hours as needed for severe pain. 65 tablet 0  . pravastatin (PRAVACHOL) 40 MG tablet Take 20 mg by mouth at bedtime.     No current facility-administered medications for this visit.     Physical Exam  Blood pressure 135/71, pulse 82, temperature 98.1 F (36.7 C), height 5\' 10"  (1.778 m), weight 323 lb 9.6 oz (146.784 kg).  Constitutional: overall normal hygiene, normal nutrition, well developed, normal grooming, normal body habitus. Assistive device:cane  Musculoskeletal: gait and station Limp left, muscle tone and strength are normal, no tremors or atrophy is present.  .  Neurological: coordination overall normal.  Deep tendon reflex/nerve stretch intact.  Sensation normal.  Cranial nerves II-XII intact.   Skin:   normal overall no scars, lesions, ulcers or rashes. No psoriasis.  Psychiatric: Alert and oriented x 3.  Recent memory intact, remote memory unclear.  Normal mood and affect. Well groomed.  Good eye contact.  Cardiovascular: overall no swelling, no varicosities, no edema bilaterally, normal temperatures of the legs and arms, no clubbing, cyanosis and good capillary refill.  Lymphatic: palpation is normal. The left lower extremity is examined:  Inspection:  Thigh:  Non-tender and no defects  Knee has swelling 2+ effusion.                        Joint tenderness is present                        Patient is tender over the medial joint line  Lower Leg:  Has normal appearance and no tenderness or defects  Ankle:  Non-tender and no defects  Foot:  Non-tender and no defects Range of Motion:  Knee:  Range of motion is: 0 to 100                        Crepitus is  present  Ankle:  Range of motion is normal. Strength and Tone:  The left lower extremity has normal strength and tone. Stability:  Knee:  The knee is stable.  Ankle:  The ankle is stable.  The patient  request injection, verbal consent was obtained.  The left knee was prepped appropriately after time out was performed.   Sterile technique was observed and injection of 1 cc of Depo-Medrol 40 mg with several cc's of plain xylocaine. Anesthesia was provided by ethyl chloride and a 20-gauge needle was used to inject the knee area. The injection was tolerated well.  A band aid dressing was applied.  The patient was advised to apply ice later today and tomorrow to the injection sight as needed.  His pain medicine is controlled by the [facility] but he has requested they consider letting me give pain medicine again.  I will need a note from them.  His hypertension is controlled well.  He has no distal edema.  His weight has been up and down but better controlled now.  The patient has been educated about the nature of the problem(s) and counseled on treatment options.  The patient appeared to understand what I have discussed and is in agreement with it.  Encounter Diagnosis  Name Primary?  . Left knee pain Yes    PLAN Call if any problems.  Precautions discussed.  Continue current medications.   Return to clinic one month

## 2016-02-13 ENCOUNTER — Ambulatory Visit: Payer: TRICARE For Life (TFL) | Admitting: Orthopaedic Surgery

## 2016-03-12 ENCOUNTER — Ambulatory Visit: Payer: Medicare Other | Admitting: Orthopaedic Surgery

## 2016-03-17 ENCOUNTER — Ambulatory Visit (INDEPENDENT_AMBULATORY_CARE_PROVIDER_SITE_OTHER): Payer: Medicare Other | Admitting: Orthopaedic Surgery

## 2016-03-17 VITALS — BP 197/93 | HR 65 | Temp 97.5°F | Ht 70.0 in | Wt 323.0 lb

## 2016-03-17 DIAGNOSIS — E291 Testicular hypofunction: Secondary | ICD-10-CM | POA: Diagnosis not present

## 2016-03-17 DIAGNOSIS — M25562 Pain in left knee: Secondary | ICD-10-CM

## 2016-03-17 NOTE — Progress Notes (Signed)
CC:  I have pain of my left knee. I would like an injection.  The patient has chronic pain of the left knee.  There is no recent trauma.  There is no redness.  Injections in the past have helped.  The knee has no redness, has an effusion and crepitus present.  ROM of the knee is 0-105.  Impression:  Chronic knee pain left.  Return: one month  PROCEDURE NOTE:  The patient requests injections of the left knee , verbal consent was obtained.  The left knee was prepped appropriately after time out was performed.   Sterile technique was observed and injection of 1 cc of Depo-Medrol 40 mg with several cc's of plain xylocaine. Anesthesia was provided by ethyl chloride and a 20-gauge needle was used to inject the knee area. The injection was tolerated well.  A band aid dressing was applied.  The patient was advised to apply ice later today and tomorrow to the injection sight as needed.

## 2016-03-23 DIAGNOSIS — R7989 Other specified abnormal findings of blood chemistry: Secondary | ICD-10-CM | POA: Diagnosis not present

## 2016-04-14 ENCOUNTER — Ambulatory Visit (INDEPENDENT_AMBULATORY_CARE_PROVIDER_SITE_OTHER): Payer: Medicare Other | Admitting: Orthopaedic Surgery

## 2016-04-14 ENCOUNTER — Encounter: Payer: Self-pay | Admitting: Orthopaedic Surgery

## 2016-04-14 VITALS — BP 119/63 | HR 72 | Temp 97.5°F | Resp 16 | Ht 70.0 in | Wt 316.0 lb

## 2016-04-14 DIAGNOSIS — E291 Testicular hypofunction: Secondary | ICD-10-CM | POA: Diagnosis not present

## 2016-04-14 DIAGNOSIS — M25562 Pain in left knee: Secondary | ICD-10-CM | POA: Diagnosis not present

## 2016-04-14 MED ORDER — OXYCODONE-ACETAMINOPHEN 7.5-325 MG PO TABS: 1.0000 | ORAL_TABLET | ORAL | Status: DC | PRN

## 2016-04-14 NOTE — Progress Notes (Signed)
Patient ID: Jon Whitney, male   DOB: August 10, 1946, 70 y.o.   MRN: [id number]  CC:  I have pain of my left knee. I would like an injection.  The patient has chronic pain of the left knee.  There is no recent trauma.  There is no redness.  Injections in the past have helped.  The knee has no redness, has an effusion and crepitus present.  ROM of the knee is 0-105.  Impression:  Chronic knee pain left.  Return: one month  PROCEDURE NOTE:  The patient requests injections of the left knee , verbal consent was obtained.  The left knee was prepped appropriately after time out was performed.   Sterile technique was observed and injection of 1 cc of Depo-Medrol 40 mg with several cc's of plain xylocaine. Anesthesia was provided by ethyl chloride and a 20-gauge needle was used to inject the knee area. The injection was tolerated well.  A band aid dressing was applied.  The patient was advised to apply ice later today and tomorrow to the injection sight as needed.

## 2016-05-07 ENCOUNTER — Ambulatory Visit (INDEPENDENT_AMBULATORY_CARE_PROVIDER_SITE_OTHER): Payer: Medicare Other | Admitting: Podiatry

## 2016-05-07 ENCOUNTER — Encounter: Payer: Self-pay | Admitting: Podiatry

## 2016-05-07 VITALS — BP 172/84 | HR 75 | Resp 16

## 2016-05-07 DIAGNOSIS — M2021 Hallux rigidus, right foot: Secondary | ICD-10-CM | POA: Diagnosis not present

## 2016-05-07 DIAGNOSIS — L84 Corns and callosities: Secondary | ICD-10-CM

## 2016-05-08 DIAGNOSIS — R7989 Other specified abnormal findings of blood chemistry: Secondary | ICD-10-CM | POA: Diagnosis not present

## 2016-05-08 NOTE — Progress Notes (Signed)
Subjective:     Patient ID: Jon Whitney, male   DOB: 08/20/1946, 70 y.o.   MRN: [id number]  HPI patient presents with healing of the first MPJ right foot but occasional discomfort and keratotic lesions plantar aspect of both feet   Review of Systems     Objective:   Physical Exam Neurovascular status found to be intact muscle strength was found to be adequate with patient found to have keratotic lesions sub-fourth metatarsal of both feet that are painful and history of hallux limitus repair right over left that's done reasonably well with mild discomfort    Assessment:     Doing well with keratotic lesion formation and hallux limitus deformity which has improved with surgery    Plan:     Reviewed both conditions and at this time deep debridement of lesions accomplished and do not recommend further surgery. Reappoint for [place] to recheck again and reevaluate in the next several months or earlier if needed

## 2016-05-12 ENCOUNTER — Ambulatory Visit (INDEPENDENT_AMBULATORY_CARE_PROVIDER_SITE_OTHER): Payer: Medicare Other | Admitting: Orthopaedic Surgery

## 2016-05-12 ENCOUNTER — Encounter: Payer: Self-pay | Admitting: Orthopaedic Surgery

## 2016-05-12 VITALS — BP 156/80 | HR 73 | Temp 97.5°F | Ht 69.0 in | Wt 314.8 lb

## 2016-05-12 DIAGNOSIS — M25562 Pain in left knee: Secondary | ICD-10-CM

## 2016-05-12 DIAGNOSIS — M48061 Spinal stenosis, lumbar region without neurogenic claudication: Secondary | ICD-10-CM

## 2016-05-12 NOTE — Progress Notes (Signed)
CC:  I have pain of my left knee. I would like an injection.  The patient has chronic pain of the left knee.  There is no recent trauma.  There is no redness.  Injections in the past have helped.  The knee has no redness, has an effusion and crepitus present.  ROM of the knee is 1-100.  Impression:  Chronic knee pain left.  Return: one month.  PROCEDURE NOTE:  The patient requests injections of the left knee , verbal consent was obtained.  The left knee was prepped appropriately after time out was performed.   Sterile technique was observed and injection of 1 cc of Depo-Medrol 40 mg with several cc's of plain xylocaine. Anesthesia was provided by ethyl chloride and a 20-gauge needle was used to inject the knee area. The injection was tolerated well.  A band aid dressing was applied.  The patient was advised to apply ice later today and tomorrow to the injection sight as needed.  Electronically Signed Sanjuana Kava, MD 6/6/20173:34 PM

## 2016-05-29 DIAGNOSIS — E291 Testicular hypofunction: Secondary | ICD-10-CM | POA: Diagnosis not present

## 2016-05-29 DIAGNOSIS — R7989 Other specified abnormal findings of blood chemistry: Secondary | ICD-10-CM | POA: Diagnosis not present

## 2016-06-10 ENCOUNTER — Ambulatory Visit: Payer: Medicare Other | Admitting: Orthopaedic Surgery

## 2016-06-15 DIAGNOSIS — L03032 Cellulitis of left toe: Secondary | ICD-10-CM | POA: Diagnosis not present

## 2016-06-15 DIAGNOSIS — L03031 Cellulitis of right toe: Secondary | ICD-10-CM | POA: Diagnosis not present

## 2016-06-15 DIAGNOSIS — L600 Ingrowing nail: Secondary | ICD-10-CM | POA: Diagnosis not present

## 2016-06-15 DIAGNOSIS — I739 Peripheral vascular disease, unspecified: Secondary | ICD-10-CM | POA: Diagnosis not present

## 2016-06-17 ENCOUNTER — Encounter: Payer: Self-pay | Admitting: Orthopaedic Surgery

## 2016-06-17 ENCOUNTER — Ambulatory Visit (INDEPENDENT_AMBULATORY_CARE_PROVIDER_SITE_OTHER): Payer: Medicare Other | Admitting: Orthopaedic Surgery

## 2016-06-17 VITALS — BP 152/80 | HR 73 | Temp 97.5°F | Ht 70.0 in | Wt 307.0 lb

## 2016-06-17 DIAGNOSIS — M48061 Spinal stenosis, lumbar region without neurogenic claudication: Secondary | ICD-10-CM

## 2016-06-17 DIAGNOSIS — M25562 Pain in left knee: Secondary | ICD-10-CM

## 2016-06-17 MED ORDER — OXYCODONE-ACETAMINOPHEN 7.5-325 MG PO TABS: 1.0000 | ORAL_TABLET | ORAL | Status: DC | PRN

## 2016-06-17 NOTE — Progress Notes (Signed)
CC:  I have pain of my left knee. I would like an injection.  The patient has chronic pain of the left knee.  There is no recent trauma.  There is no redness.  Injections in the past have helped.  The knee has no redness, has an effusion and crepitus present.  ROM of the knee is 0-105.  Impression:  Chronic knee pain left.  Return: 1 month   PROCEDURE NOTE:  The patient requests injections of the left knee , verbal consent was obtained.  The left knee was prepped appropriately after time out was performed.   Sterile technique was observed and injection of 1 cc of Depo-Medrol 40 mg with several cc's of plain xylocaine. Anesthesia was provided by ethyl chloride and a 20-gauge needle was used to inject the knee area. The injection was tolerated well.  A band aid dressing was applied.  The patient was advised to apply ice later today and tomorrow to the injection sight as needed.  Electronically Signed Sanjuana Kava, MD 7/12/20172:08 PM

## 2016-06-23 DIAGNOSIS — E291 Testicular hypofunction: Secondary | ICD-10-CM | POA: Diagnosis not present

## 2016-07-02 ENCOUNTER — Ambulatory Visit: Payer: Medicare Other | Admitting: Orthopedic Surgery

## 2016-07-09 ENCOUNTER — Ambulatory Visit: Payer: Medicare Other | Admitting: Orthopedic Surgery

## 2016-07-10 DIAGNOSIS — R7989 Other specified abnormal findings of blood chemistry: Secondary | ICD-10-CM | POA: Diagnosis not present

## 2016-07-21 ENCOUNTER — Encounter: Payer: Self-pay | Admitting: Orthopaedic Surgery

## 2016-07-21 ENCOUNTER — Ambulatory Visit (INDEPENDENT_AMBULATORY_CARE_PROVIDER_SITE_OTHER): Payer: Medicare Other | Admitting: Orthopaedic Surgery

## 2016-07-21 VITALS — BP 151/79 | HR 71 | Temp 98.1°F | Ht 70.0 in | Wt 314.0 lb

## 2016-07-21 DIAGNOSIS — M25562 Pain in left knee: Secondary | ICD-10-CM | POA: Diagnosis not present

## 2016-07-21 MED ORDER — OXYCODONE-ACETAMINOPHEN 7.5-325 MG PO TABS: 1.0000 | ORAL_TABLET | ORAL | 0 refills | Status: DC | PRN

## 2016-07-21 NOTE — Progress Notes (Signed)
Patient [id number] Jon Whitney, male DOB:12/17/1945, 70 y.o. [id number]  Chief Complaint  Patient presents with  . Follow-up    left knee    HPI  Jon Whitney is a 70 y.o. male who has chronic left knee pain.  He uses a knee sleeve.  He was seen at the [facility] last week and had an injection.  He has no giving way, no redness.  He has swelling and popping and no new trauma. HPI  Body mass index is 45.05 kg/m.  ROS  Review of Systems  Constitutional:       Patient does not have Diabetes Mellitus. Patient has hypertension. Patient has COPD or shortness of breath. Patient has BMI > 35. Patient does not have current smoking history.  HENT: Negative for congestion.   Respiratory: Positive for cough, shortness of breath and wheezing.   Cardiovascular: Negative for chest pain.  Gastrointestinal:       GERD  Endocrine: Positive for cold intolerance.  Musculoskeletal: Positive for arthralgias, gait problem, joint swelling and myalgias.  Allergic/Immunologic: Positive for environmental allergies.  Neurological: Negative for numbness.  Psychiatric/Behavioral: The patient is nervous/anxious.     Past Medical History:  Diagnosis Date  . Anxiety   . Arthritis   . Asthma   . 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 GERD (gastroesophageal reflux disease)   . H/O hiatal hernia   . Heart murmur    as a child "it went away"  . Hypertension   . Psychiatric disorder   . PTSD (post-traumatic stress disorder)   . Sleep apnea    uses cpap    Past Surgical History:  Procedure Laterality Date  . COLONOSCOPY    . FOOT SURGERY Right   . HAND SURGERY Right    finger amputations  . MOUTH SURGERY     prosthetic soft palate    Family History  Problem Relation Age of  Onset  . Asthma Mother   . Pneumonia Mother   . Bone cancer Father     Social History Social History  Substance Use Topics  . Smoking status: Former Smoker    Quit date: 08/28/1967  . Smokeless tobacco: Never Used  . Alcohol use Yes     Comment: occasionally    Allergies  Allergen Reactions  . Penicillins Hives and Rash    Current Outpatient Prescriptions  Medication Sig Dispense Refill  . albuterol (PROVENTIL HFA;VENTOLIN HFA) 108 (90 BASE) MCG/ACT inhaler Inhale 1 puff into the lungs every 6 (six) hours as needed for wheezing or shortness of breath.    . ALPRAZolam (XANAX) 0.5 MG tablet Take 0.5 mg by mouth 3 (three) times daily.    Marland Kitchen amLODipine (NORVASC) 10 MG tablet Take 10 mg by mouth daily.    . cetirizine (ZYRTEC) 10 MG tablet Take 10 mg by mouth daily.    Marland Kitchen docusate sodium (COLACE) 100 MG capsule Take 200 mg by mouth daily.    . fluticasone (FLONASE) 50 MCG/ACT nasal spray Place 2 sprays into the nose daily.    . hydrOXYzine (ATARAX/VISTARIL) 25 MG tablet Take 50 mg by mouth 3 (three) times daily.    . methocarbamol (ROBAXIN) 500 MG tablet Take 500 mg by mouth 3 (three) times daily as needed for muscle spasms.    Marland Kitchen  Multiple Vitamin (MULTIVITAMIN WITH MINERALS) TABS tablet Take 1 tablet by mouth daily.    Marland Kitchen omeprazole (PRILOSEC) 20 MG capsule Take 20 mg by mouth 3 (three) times daily before meals.    . ondansetron (ZOFRAN ODT) 8 MG disintegrating tablet Take 1 tablet (8 mg total) by mouth every 8 (eight) hours as needed for nausea or vomiting. 10 tablet 0  . oxyCODONE-acetaminophen (PERCOCET) 7.5-325 MG tablet Take 1 tablet by mouth every 4 (four) hours as needed for moderate pain or severe pain (Must last 30 days.Do not take and drive a car or operate machinery). 120 tablet 0  . pravastatin (PRAVACHOL) 40 MG tablet Take 20 mg by mouth at bedtime.     No current facility-administered medications for this visit.      Physical Exam  Blood pressure (!) 151/79, pulse  71, temperature 98.1 F (36.7 C), height 5\' 10"  (1.778 m), weight (!) 314 lb (142.4 kg).  Constitutional: overall normal hygiene, normal nutrition, well developed, normal grooming, normal body habitus. Assistive device:neoprene sleeve brace  Musculoskeletal: gait and station Limp left, muscle tone and strength are normal, no tremors or atrophy is present.  .  Neurological: coordination overall normal.  Deep tendon reflex/nerve stretch intact.  Sensation normal.  Cranial nerves II-XII intact.   Skin:   normal overall no scars, lesions, ulcers or rashes. No psoriasis.  Psychiatric: Alert and oriented x 3.  Recent memory intact, remote memory unclear.  Normal mood and affect. Well groomed.  Good eye contact.  Cardiovascular: overall no swelling, no varicosities, no edema bilaterally, normal temperatures of the legs and arms, no clubbing, cyanosis and good capillary refill.  Lymphatic: palpation is normal.  The left lower extremity is examined:  Inspection:  Thigh:  Non-tender and no defects  Knee has swelling 1+ effusion.                        Joint tenderness is present                        Patient is tender over the medial joint line  Lower Leg:  Has normal appearance and no tenderness or defects  Ankle:  Non-tender and no defects  Foot:  Non-tender and no defects Range of Motion:  Knee:  Range of motion is: 0-105                        Crepitus is  present  Ankle:  Range of motion is normal. Strength and Tone:  The left lower extremity has normal strength and tone. Stability:  Knee:  The knee is stable.  Ankle:  The ankle is stable.    The patient has been educated about the nature of the problem(s) and counseled on treatment options.  The patient appeared to understand what I have discussed and is in agreement with it.  Encounter Diagnosis  Name Primary?  . Left knee pain Yes    PLAN Call if any problems.  Precautions discussed.  Continue current medications.    Return to clinic 1 month   Electronically Signed Sanjuana Kava, MD 8/15/20172:02 PM

## 2016-08-06 DIAGNOSIS — R7989 Other specified abnormal findings of blood chemistry: Secondary | ICD-10-CM | POA: Diagnosis not present

## 2016-08-25 ENCOUNTER — Ambulatory Visit: Payer: Medicare Other | Admitting: Orthopaedic Surgery

## 2016-08-26 ENCOUNTER — Ambulatory Visit: Payer: Medicare Other | Admitting: Orthopaedic Surgery

## 2016-08-31 DIAGNOSIS — R7989 Other specified abnormal findings of blood chemistry: Secondary | ICD-10-CM | POA: Diagnosis not present

## 2016-08-31 DIAGNOSIS — Z23 Encounter for immunization: Secondary | ICD-10-CM | POA: Diagnosis not present

## 2016-09-01 DIAGNOSIS — I739 Peripheral vascular disease, unspecified: Secondary | ICD-10-CM | POA: Diagnosis not present

## 2016-09-02 ENCOUNTER — Ambulatory Visit (INDEPENDENT_AMBULATORY_CARE_PROVIDER_SITE_OTHER): Payer: Medicare Other | Admitting: Orthopaedic Surgery

## 2016-09-02 ENCOUNTER — Encounter: Payer: Self-pay | Admitting: Orthopaedic Surgery

## 2016-09-02 VITALS — BP 133/63 | HR 69 | Temp 97.5°F | Ht 70.0 in | Wt 305.0 lb

## 2016-09-02 DIAGNOSIS — M25562 Pain in left knee: Secondary | ICD-10-CM | POA: Diagnosis not present

## 2016-09-02 MED ORDER — OXYCODONE-ACETAMINOPHEN 7.5-325 MG PO TABS: 1.0000 | ORAL_TABLET | ORAL | 0 refills | Status: DC | PRN

## 2016-09-02 NOTE — Progress Notes (Signed)
CC:  I have pain of my left knee. I would like an injection.  The patient has chronic pain of the left knee.  There is no recent trauma.  There is no redness.  Injections in the past have helped.  The knee has no redness, has an effusion and crepitus present.  ROM of the left knee is 0-110.  Impression:  Chronic knee pain left  Return: 1 month  PROCEDURE NOTE:  The patient requests injections of the lft knee, verbal consent was obtained.  The left knee was prepped appropriately after time out was performed.   Sterile technique was observed and injection of 1 cc of Depo-Medrol 40 mg with several cc's of plain xylocaine. Anesthesia was provided by ethyl chloride and a 20-gauge needle was used to inject the knee area. The injection was tolerated well.  A band aid dressing was applied.  The patient was advised to apply ice later today and tomorrow to the injection sight as needed.  Electronically Signed Sanjuana Kava, MD 9/27/20172:17 PM

## 2016-09-22 DIAGNOSIS — R7989 Other specified abnormal findings of blood chemistry: Secondary | ICD-10-CM | POA: Diagnosis not present

## 2016-09-30 ENCOUNTER — Ambulatory Visit (INDEPENDENT_AMBULATORY_CARE_PROVIDER_SITE_OTHER): Payer: Medicare Other | Admitting: Orthopaedic Surgery

## 2016-09-30 ENCOUNTER — Encounter: Payer: Self-pay | Admitting: Orthopaedic Surgery

## 2016-09-30 VITALS — BP 150/77 | HR 77 | Temp 97.9°F | Ht 70.0 in | Wt 312.0 lb

## 2016-09-30 DIAGNOSIS — M65332 Trigger finger, left middle finger: Secondary | ICD-10-CM | POA: Diagnosis not present

## 2016-09-30 MED ORDER — OXYCODONE-ACETAMINOPHEN 7.5-325 MG PO TABS: 1.0000 | ORAL_TABLET | ORAL | 0 refills | Status: DC | PRN

## 2016-09-30 NOTE — Progress Notes (Signed)
Patient Jon Whitney, male DOB:August 29, 1946, 70 y.o. DO:[id number]  Chief Complaint  Patient presents with  . Follow-up    left knee pain    HPI  Jon Whitney is a 70 y.o. male who has developed a trigger finger of the left long finger, nondominant hand.  He was seen for this at the [facility] in [place] and had an injection in the past.  I have told him that surgery will correct it.  He would like an injection today.  He has locking of the finger first thing in the morning and then at times during the day.  He has no numbness or redness, and no trauma. HPI  Body mass index is 44.77 kg/m.  ROS  Review of Systems  Constitutional:       Patient does not have Diabetes Mellitus. Patient has hypertension. Patient has COPD or shortness of breath. Patient has BMI > 35. Patient does not have current smoking history.  HENT: Negative for congestion.   Respiratory: Positive for cough, shortness of breath and wheezing.   Cardiovascular: Negative for chest pain.  Gastrointestinal:       GERD  Endocrine: Positive for cold intolerance.  Musculoskeletal: Positive for arthralgias, gait problem, joint swelling and myalgias.  Allergic/Immunologic: Positive for environmental allergies.  Neurological: Negative for numbness.  Psychiatric/Behavioral: The patient is nervous/anxious.     Past Medical History:  Diagnosis Date  . Anxiety   . Arthritis   . Asthma   . 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 GERD (gastroesophageal reflux disease)   . H/O hiatal hernia   . Heart murmur    as a child "it went away"  . Hypertension   . Psychiatric disorder   . PTSD (post-traumatic stress disorder)   . Sleep apnea    uses cpap    Past Surgical History:  Procedure Laterality Date  .  COLONOSCOPY    . FOOT SURGERY Right   . HAND SURGERY Right    finger amputations  . MOUTH SURGERY     prosthetic soft palate    Family History  Problem Relation Age of Onset  . Asthma Mother   . Pneumonia Mother   . Bone cancer Father     Social History Social History  Substance Use Topics  . Smoking status: Former Smoker    Quit date: 08/28/1967  . Smokeless tobacco: Never Used  . Alcohol use Yes     Comment: occasionally    Allergies  Allergen Reactions  . Penicillins Hives and Rash    Current Outpatient Prescriptions  Medication Sig Dispense Refill  . albuterol (PROVENTIL HFA;VENTOLIN HFA) 108 (90 BASE) MCG/ACT inhaler Inhale 1 puff into the lungs every 6 (six) hours as needed for wheezing or shortness of breath.    . ALPRAZolam (XANAX) 0.5 MG tablet Take 0.5 mg by mouth 3 (three) times daily.    Marland Kitchen amLODipine (NORVASC) 10 MG tablet Take 10 mg by mouth daily.    . cetirizine (ZYRTEC) 10 MG tablet Take 10 mg by mouth daily.    Marland Kitchen docusate sodium (COLACE) 100 MG capsule Take 200 mg by mouth daily.    . fluticasone (FLONASE) 50 MCG/ACT nasal spray Place 2 sprays into the nose daily.    . hydrOXYzine (ATARAX/VISTARIL) 25 MG tablet Take  50 mg by mouth 3 (three) times daily.    . methocarbamol (ROBAXIN) 500 MG tablet Take 500 mg by mouth 3 (three) times daily as needed for muscle spasms.    . Multiple Vitamin (MULTIVITAMIN WITH MINERALS) TABS tablet Take 1 tablet by mouth daily.    Marland Kitchen omeprazole (PRILOSEC) 20 MG capsule Take 20 mg by mouth 3 (three) times daily before meals.    . ondansetron (ZOFRAN ODT) 8 MG disintegrating tablet Take 1 tablet (8 mg total) by mouth every 8 (eight) hours as needed for nausea or vomiting. 10 tablet 0  . oxyCODONE-acetaminophen (PERCOCET) 7.5-325 MG tablet Take 1 tablet by mouth every 4 (four) hours as needed for moderate pain or severe pain (Must last 30 days.Do not take and drive a car or operate machinery). 110 tablet 0  . pravastatin  (PRAVACHOL) 40 MG tablet Take 20 mg by mouth at bedtime.     No current facility-administered medications for this visit.      Physical Exam  Blood pressure (!) 150/77, pulse 77, temperature 97.9 F (36.6 C), height 5\' 10"  (1.778 m), weight (!) 312 lb (141.5 kg).  Constitutional: overall normal hygiene, normal nutrition, well developed, normal grooming, normal body habitus. Assistive device:none  Musculoskeletal: gait and station Limp none, muscle tone and strength are normal, no tremors or atrophy is present.  .  Neurological: coordination overall normal.  Deep tendon reflex/nerve stretch intact.  Sensation normal.  Cranial nerves II-XII intact.   Skin:   Normal overall no scars, lesions, ulcers or rashes. No psoriasis.  Psychiatric: Alert and oriented x 3.  Recent memory intact, remote memory unclear.  Normal mood and affect. Well groomed.  Good eye contact.  Cardiovascular: overall no swelling, no varicosities, no edema bilaterally, normal temperatures of the legs and arms, no clubbing, cyanosis and good capillary refill.  Lymphatic: palpation is normal.  He has locking of the left long finger at the A1 pulley area.  NV intact.  Other fingers negative.  The patient has been educated about the nature of the problem(s) and counseled on treatment options.  The patient appeared to understand what I have discussed and is in agreement with it.  Encounter Diagnosis  Name Primary?  . Trigger finger, left middle finger Yes   Procedure note:  After sterile prep and permission from the patient, the A1 pulley area of the left long finger was injected by sterile technique with 1 % Xylocaine and 1 cc of DepoMedrol 40 tolerated well.  PLAN Call if any problems.  Precautions discussed.  Continue current medications.   Return to clinic prn   Electronically Signed Sanjuana Kava, MD 10/25/20171:48 PM

## 2016-10-16 DIAGNOSIS — R7989 Other specified abnormal findings of blood chemistry: Secondary | ICD-10-CM | POA: Diagnosis not present

## 2016-12-03 DIAGNOSIS — R7989 Other specified abnormal findings of blood chemistry: Secondary | ICD-10-CM | POA: Diagnosis not present

## 2016-12-15 ENCOUNTER — Ambulatory Visit: Payer: Medicare Other | Admitting: Orthopaedic Surgery

## 2016-12-17 ENCOUNTER — Ambulatory Visit: Payer: Medicare Other | Admitting: Orthopaedic Surgery

## 2016-12-21 DIAGNOSIS — I739 Peripheral vascular disease, unspecified: Secondary | ICD-10-CM | POA: Diagnosis not present

## 2016-12-24 ENCOUNTER — Ambulatory Visit: Payer: Medicare Other | Admitting: Orthopaedic Surgery

## 2016-12-25 ENCOUNTER — Ambulatory Visit (INDEPENDENT_AMBULATORY_CARE_PROVIDER_SITE_OTHER): Payer: Medicare Other | Admitting: Orthopaedic Surgery

## 2016-12-25 VITALS — BP 166/77 | HR 80 | Temp 97.5°F | Ht 70.0 in | Wt 320.0 lb

## 2016-12-25 DIAGNOSIS — M25562 Pain in left knee: Secondary | ICD-10-CM | POA: Diagnosis not present

## 2016-12-25 DIAGNOSIS — G8929 Other chronic pain: Secondary | ICD-10-CM | POA: Diagnosis not present

## 2016-12-25 DIAGNOSIS — R7989 Other specified abnormal findings of blood chemistry: Secondary | ICD-10-CM | POA: Diagnosis not present

## 2016-12-25 MED ORDER — TRAMADOL HCL 50 MG PO TABS: 50.0000 mg | ORAL_TABLET | Freq: Four times a day (QID) | ORAL | 3 refills | Status: DC | PRN

## 2016-12-25 NOTE — Progress Notes (Signed)
CC:  I have pain of my left knee. I would like an injection.  The patient has chronic pain of the left knee.  There is no recent trauma.  There is no redness.  Injections in the past have helped.  The knee has no redness, has an effusion and crepitus present.  ROM of the left knee is 0-105.  Impression:  Chronic knee pain left  Return: 1 month  PROCEDURE NOTE:  The patient requests injections of the left knee, verbal consent was obtained.  The left knee was prepped appropriately after time out was performed.   Sterile technique was observed and injection of 1 cc of Depo-Medrol 40 mg with several cc's of plain xylocaine. Anesthesia was provided by ethyl chloride and a 20-gauge needle was used to inject the knee area. The injection was tolerated well.  A band aid dressing was applied.  The patient was advised to apply ice later today and tomorrow to the injection sight as needed.  I have given Toradol for his pain.  I checked the state narcotic site first prior to Rx.  Call if any problem. Electronically Signed Sanjuana Kava, MD 1/19/20189:41 AM

## 2017-01-08 DIAGNOSIS — E291 Testicular hypofunction: Secondary | ICD-10-CM | POA: Diagnosis not present

## 2017-01-21 ENCOUNTER — Encounter: Payer: Self-pay | Admitting: Orthopaedic Surgery

## 2017-01-21 ENCOUNTER — Ambulatory Visit (INDEPENDENT_AMBULATORY_CARE_PROVIDER_SITE_OTHER): Payer: Medicare Other | Admitting: Orthopaedic Surgery

## 2017-01-21 VITALS — BP 140/69 | HR 62 | Temp 98.1°F | Ht 70.0 in | Wt 323.0 lb

## 2017-01-21 DIAGNOSIS — M25562 Pain in left knee: Secondary | ICD-10-CM | POA: Diagnosis not present

## 2017-01-21 DIAGNOSIS — G8929 Other chronic pain: Secondary | ICD-10-CM | POA: Diagnosis not present

## 2017-01-21 MED ORDER — OXYCODONE-ACETAMINOPHEN 7.5-325 MG PO TABS: 1.0000 | ORAL_TABLET | Freq: Four times a day (QID) | ORAL | 0 refills | Status: DC | PRN

## 2017-01-21 NOTE — Progress Notes (Signed)
CC:  I have pain of my left knee. I would like an injection.  The patient has chronic pain of the left knee.  There is no recent trauma.  There is no redness.  Injections in the past have helped.  The knee has no redness, has an effusion and crepitus present.  ROM of the left knee is 0-105.  Impression:  Chronic knee pain left  Return: 1 month  PROCEDURE NOTE:  The patient requests injections of the left knee, verbal consent was obtained.  The left knee was prepped appropriately after time out was performed.   Sterile technique was observed and injection of 1 cc of Depo-Medrol 40 mg with several cc's of plain xylocaine. Anesthesia was provided by ethyl chloride and a 20-gauge needle was used to inject the knee area. The injection was tolerated well.  A band aid dressing was applied.  The patient was advised to apply ice later today and tomorrow to the injection sight as needed.  I have reviewed the [place] web site prior to prescribing narcotic medicine for this patient.  Electronically Signed Sanjuana Kava, MD 2/15/20182:33 PM

## 2017-01-28 DIAGNOSIS — R7989 Other specified abnormal findings of blood chemistry: Secondary | ICD-10-CM | POA: Diagnosis not present

## 2017-02-18 ENCOUNTER — Ambulatory Visit: Payer: Medicare Other | Admitting: Orthopaedic Surgery

## 2017-02-19 DIAGNOSIS — R7989 Other specified abnormal findings of blood chemistry: Secondary | ICD-10-CM | POA: Diagnosis not present

## 2017-02-24 ENCOUNTER — Ambulatory Visit: Payer: Medicare Other | Admitting: Orthopaedic Surgery

## 2017-03-17 ENCOUNTER — Encounter: Payer: Self-pay | Admitting: Orthopaedic Surgery

## 2017-03-17 ENCOUNTER — Ambulatory Visit (INDEPENDENT_AMBULATORY_CARE_PROVIDER_SITE_OTHER): Payer: Medicare Other | Admitting: Orthopaedic Surgery

## 2017-03-17 VITALS — BP 152/93 | HR 71 | Temp 97.3°F | Ht 70.0 in | Wt 322.0 lb

## 2017-03-17 DIAGNOSIS — G8929 Other chronic pain: Secondary | ICD-10-CM | POA: Diagnosis not present

## 2017-03-17 DIAGNOSIS — M25562 Pain in left knee: Secondary | ICD-10-CM | POA: Diagnosis not present

## 2017-03-17 NOTE — Progress Notes (Signed)
CC:  I have pain of my left knee. I would like an injection.  The patient has chronic pain of the left knee.  There is no recent trauma.  There is no redness.  Injections in the past have helped.  The knee has no redness, has an effusion and crepitus present.  ROM of the left knee is 0-100.  Impression:  Chronic knee pain left  Return: 2 months  PROCEDURE NOTE:  The patient requests injections of the left knee, verbal consent was obtained.  The left knee was prepped appropriately after time out was performed.   Sterile technique was observed and injection of 1 cc of Depo-Medrol 40 mg with several cc's of plain xylocaine. Anesthesia was provided by ethyl chloride and a 20-gauge needle was used to inject the knee area. The injection was tolerated well.  A band aid dressing was applied.  The patient was advised to apply ice later today and tomorrow to the injection sight as needed.  Electronically Signed Sanjuana Kava, MD 4/11/201810:03 AM

## 2017-03-24 DIAGNOSIS — E291 Testicular hypofunction: Secondary | ICD-10-CM | POA: Diagnosis not present

## 2017-04-06 DIAGNOSIS — E782 Mixed hyperlipidemia: Secondary | ICD-10-CM | POA: Diagnosis not present

## 2017-04-06 DIAGNOSIS — Z0000 Encounter for general adult medical examination without abnormal findings: Secondary | ICD-10-CM | POA: Diagnosis not present

## 2017-04-06 DIAGNOSIS — Z1389 Encounter for screening for other disorder: Secondary | ICD-10-CM | POA: Diagnosis not present

## 2017-04-07 DIAGNOSIS — Z0000 Encounter for general adult medical examination without abnormal findings: Secondary | ICD-10-CM | POA: Diagnosis not present

## 2017-04-07 DIAGNOSIS — R7989 Other specified abnormal findings of blood chemistry: Secondary | ICD-10-CM | POA: Diagnosis not present

## 2017-04-07 DIAGNOSIS — E782 Mixed hyperlipidemia: Secondary | ICD-10-CM | POA: Diagnosis not present

## 2017-04-07 DIAGNOSIS — R7309 Other abnormal glucose: Secondary | ICD-10-CM | POA: Diagnosis not present

## 2017-04-07 DIAGNOSIS — Z1389 Encounter for screening for other disorder: Secondary | ICD-10-CM | POA: Diagnosis not present

## 2017-04-28 ENCOUNTER — Encounter: Payer: Self-pay | Admitting: Orthopaedic Surgery

## 2017-04-28 ENCOUNTER — Ambulatory Visit (INDEPENDENT_AMBULATORY_CARE_PROVIDER_SITE_OTHER): Payer: Medicare Other | Admitting: Orthopaedic Surgery

## 2017-04-28 DIAGNOSIS — G8929 Other chronic pain: Secondary | ICD-10-CM

## 2017-04-28 DIAGNOSIS — M25562 Pain in left knee: Secondary | ICD-10-CM

## 2017-04-28 NOTE — Progress Notes (Signed)
CC:  I have pain of my left knee. I would like an injection.  The patient has chronic pain of the left knee.  There is no recent trauma.  There is no redness.  Injections in the past have helped.  The knee has no redness, has an effusion and crepitus present.  ROM of the left knee is 0-110.  Impression:  Chronic knee pain left  Return: 2 months  PROCEDURE NOTE:  The patient requests injections of the left knee, verbal consent was obtained.  The left knee was prepped appropriately after time out was performed.   Sterile technique was observed and injection of 1 cc of Depo-Medrol 40 mg with several cc's of plain xylocaine. Anesthesia was provided by ethyl chloride and a 20-gauge needle was used to inject the knee area. The injection was tolerated well.  A band aid dressing was applied.  The patient was advised to apply ice later today and tomorrow to the injection sight as needed.  Electronically Signed Sanjuana Kava, MD 5/23/20183:10 PM

## 2017-05-07 DIAGNOSIS — R7989 Other specified abnormal findings of blood chemistry: Secondary | ICD-10-CM | POA: Diagnosis not present

## 2017-05-25 ENCOUNTER — Ambulatory Visit: Payer: Medicare Other | Admitting: Orthopaedic Surgery

## 2017-05-26 DIAGNOSIS — E291 Testicular hypofunction: Secondary | ICD-10-CM | POA: Diagnosis not present

## 2017-06-14 DIAGNOSIS — I739 Peripheral vascular disease, unspecified: Secondary | ICD-10-CM | POA: Diagnosis not present

## 2017-06-16 ENCOUNTER — Encounter: Payer: Self-pay | Admitting: Orthopaedic Surgery

## 2017-06-16 ENCOUNTER — Ambulatory Visit (INDEPENDENT_AMBULATORY_CARE_PROVIDER_SITE_OTHER): Payer: Medicare Other | Admitting: Orthopaedic Surgery

## 2017-06-16 VITALS — BP 166/89 | HR 79 | Temp 97.0°F | Ht 70.0 in | Wt 311.0 lb

## 2017-06-16 DIAGNOSIS — G8929 Other chronic pain: Secondary | ICD-10-CM

## 2017-06-16 DIAGNOSIS — M25562 Pain in left knee: Secondary | ICD-10-CM

## 2017-06-16 DIAGNOSIS — R7989 Other specified abnormal findings of blood chemistry: Secondary | ICD-10-CM | POA: Diagnosis not present

## 2017-06-16 NOTE — Progress Notes (Signed)
CC:  I have pain of my left knee. I would like an injection.  The patient has chronic pain of the left knee.  There is no recent trauma.  There is no redness.  Injections in the past have helped.  The knee has no redness, has an effusion and crepitus present.  ROM of the left knee is 0-110.  Impression:  Chronic knee pain left  Return: 1 month  PROCEDURE NOTE:  The patient requests injections of the left knee, verbal consent was obtained.  The left knee was prepped appropriately after time out was performed.   Sterile technique was observed and injection of 1 cc of Depo-Medrol 40 mg with several cc's of plain xylocaine. Anesthesia was provided by ethyl chloride and a 20-gauge needle was used to inject the knee area. The injection was tolerated well.  A band aid dressing was applied.  The patient was advised to apply ice later today and tomorrow to the injection sight as needed.  Electronically Signed Sanjuana Kava, MD 7/11/20189:35 AM

## 2017-06-22 ENCOUNTER — Emergency Department (HOSPITAL_COMMUNITY)
Admission: EM | Admit: 2017-06-22 | Discharge: 2017-06-23 | Disposition: A | Discharge status: Home / Self Care | Payer: Medicare Other | Attending: Emergency Medicine | Admitting: Emergency Medicine

## 2017-06-22 ENCOUNTER — Emergency Department (HOSPITAL_COMMUNITY): Payer: Medicare Other

## 2017-06-22 ENCOUNTER — Encounter (HOSPITAL_COMMUNITY): Payer: Self-pay

## 2017-06-22 DIAGNOSIS — Z79899 Other long term (current) drug therapy: Secondary | ICD-10-CM | POA: Diagnosis not present

## 2017-06-22 DIAGNOSIS — Y999 Unspecified external cause status: Secondary | ICD-10-CM | POA: Insufficient documentation

## 2017-06-22 DIAGNOSIS — J45909 Unspecified asthma, uncomplicated: Secondary | ICD-10-CM | POA: Diagnosis not present

## 2017-06-22 DIAGNOSIS — S6991XA Unspecified injury of right wrist, hand and finger(s), initial encounter: Secondary | ICD-10-CM | POA: Diagnosis present

## 2017-06-22 DIAGNOSIS — Y92481 Parking lot as the place of occurrence of the external cause: Secondary | ICD-10-CM | POA: Diagnosis not present

## 2017-06-22 DIAGNOSIS — Y939 Activity, unspecified: Secondary | ICD-10-CM | POA: Insufficient documentation

## 2017-06-22 DIAGNOSIS — S60221A Contusion of right hand, initial encounter: Secondary | ICD-10-CM | POA: Diagnosis not present

## 2017-06-22 DIAGNOSIS — Z87891 Personal history of nicotine dependence: Secondary | ICD-10-CM | POA: Diagnosis not present

## 2017-06-22 DIAGNOSIS — M25441 Effusion, right hand: Secondary | ICD-10-CM | POA: Insufficient documentation

## 2017-06-22 DIAGNOSIS — I10 Essential (primary) hypertension: Secondary | ICD-10-CM | POA: Insufficient documentation

## 2017-06-22 DIAGNOSIS — C111 Malignant neoplasm of posterior wall of nasopharynx: Secondary | ICD-10-CM | POA: Insufficient documentation

## 2017-06-22 DIAGNOSIS — M7989 Other specified soft tissue disorders: Secondary | ICD-10-CM | POA: Diagnosis not present

## 2017-06-22 DIAGNOSIS — M79641 Pain in right hand: Secondary | ICD-10-CM | POA: Diagnosis not present
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
## 2017-06-23 NOTE — ED Notes (Signed)
Pt given ice pack and bulky dressing applied to right hand

## 2017-06-23 NOTE — Discharge Instructions (Signed)
Elevate and apply ice packs on/off to your hand.  Tylenol or ibuprofen if needed for pain.  Keep your hand bandaged as needed for several days.  Call Dr. Brooke Bonito office to arrange a follow-up if needed

## 2017-06-24 ENCOUNTER — Ambulatory Visit (INDEPENDENT_AMBULATORY_CARE_PROVIDER_SITE_OTHER): Payer: Medicare Other | Admitting: Orthopaedic Surgery

## 2017-06-24 ENCOUNTER — Encounter: Payer: Self-pay | Admitting: Orthopaedic Surgery

## 2017-06-24 VITALS — BP 160/81 | HR 93 | Temp 98.6°F | Ht 70.0 in | Wt 308.0 lb

## 2017-06-24 DIAGNOSIS — M79641 Pain in right hand: Secondary | ICD-10-CM
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
## 2017-06-29 ENCOUNTER — Ambulatory Visit: Payer: Medicare Other | Admitting: Orthopaedic Surgery
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
## 2017-07-14 ENCOUNTER — Ambulatory Visit: Payer: Medicare Other | Admitting: Orthopaedic Surgery

## 2017-07-20 ENCOUNTER — Encounter: Payer: Self-pay | Admitting: Orthopaedic Surgery

## 2017-07-20 ENCOUNTER — Ambulatory Visit (INDEPENDENT_AMBULATORY_CARE_PROVIDER_SITE_OTHER): Payer: Medicare Other | Admitting: Orthopaedic Surgery

## 2017-07-20 VITALS — BP 158/79 | HR 72 | Temp 98.9°F | Ht 71.0 in | Wt 306.0 lb

## 2017-07-20 DIAGNOSIS — M25562 Pain in left knee: Secondary | ICD-10-CM

## 2017-07-20 DIAGNOSIS — G8929 Other chronic pain: Secondary | ICD-10-CM | POA: Diagnosis not present

## 2017-07-20 NOTE — Progress Notes (Signed)
CC:  I have pain of my left knee. I would like an injection.  The patient has chronic pain of the left knee.  There is no recent trauma.  There is no redness.  Injections in the past have helped.  The knee has no redness, has an effusion and crepitus present.  ROM of the left knee is 0-110.  Impression:  Chronic knee pain left  Return: 1 month  PROCEDURE NOTE:  The patient requests injections of the left knee, verbal consent was obtained.  The left knee was prepped appropriately after time out was performed.   Sterile technique was observed and injection of 1 cc of Depo-Medrol 40 mg with several cc's of plain xylocaine. Anesthesia was provided by ethyl chloride and a 20-gauge needle was used to inject the knee area. The injection was tolerated well.  A band aid dressing was applied.  The patient was advised to apply ice later today and tomorrow to the injection sight as needed.  Electronically Signed Sanjuana Kava, MD 8/14/20182:26 PM

## 2017-07-26 DIAGNOSIS — M79671 Pain in right foot: Secondary | ICD-10-CM | POA: Diagnosis not present

## 2017-07-26 DIAGNOSIS — S9780XA Crushing injury of unspecified foot, initial encounter: Secondary | ICD-10-CM | POA: Diagnosis not present

## 2017-07-26 DIAGNOSIS — M79672 Pain in left foot: Secondary | ICD-10-CM | POA: Diagnosis not present

## 2017-07-26 DIAGNOSIS — S9030XA Contusion of unspecified foot, initial encounter: Secondary | ICD-10-CM | POA: Diagnosis not present

## 2017-07-30 DIAGNOSIS — R7989 Other specified abnormal findings of blood chemistry: Secondary | ICD-10-CM | POA: Diagnosis not present

## 2017-08-11 MED ORDER — SODIUM CHLORIDE 0.9% FLUSH
INTRAVENOUS | Status: AC
  Filled 2017-08-11: qty 180

## 2017-08-17 ENCOUNTER — Encounter: Payer: Self-pay | Admitting: Orthopaedic Surgery

## 2017-08-17 ENCOUNTER — Ambulatory Visit (INDEPENDENT_AMBULATORY_CARE_PROVIDER_SITE_OTHER): Payer: Medicare Other | Admitting: Orthopaedic Surgery

## 2017-08-17 VITALS — BP 132/71 | HR 80 | Temp 97.7°F | Ht 71.0 in | Wt 315.0 lb

## 2017-08-17 DIAGNOSIS — M25562 Pain in left knee: Secondary | ICD-10-CM

## 2017-08-17 DIAGNOSIS — G8929 Other chronic pain: Secondary | ICD-10-CM | POA: Diagnosis not present

## 2017-08-17 NOTE — Progress Notes (Signed)
CC:  I have pain of my left knee. I would like an injection.  The patient has chronic pain of the left knee.  There is no recent trauma.  There is no redness.  Injections in the past have helped.  The knee has no redness, has an effusion and crepitus present.  ROM of the left knee is 0-110.  Impression:  Chronic knee pain left  Return: 1 month  PROCEDURE NOTE:  The patient requests injections of the left knee, verbal consent was obtained.  The left knee was prepped appropriately after time out was performed.   Sterile technique was observed and injection of 1 cc of Depo-Medrol 40 mg with several cc's of plain xylocaine. Anesthesia was provided by ethyl chloride and a 20-gauge needle was used to inject the knee area. The injection was tolerated well.  A band aid dressing was applied.  The patient was advised to apply ice later today and tomorrow to the injection sight as needed.  Electronically Signed Sanjuana Kava, MD 9/11/20181:58 PM

## 2017-09-15 ENCOUNTER — Ambulatory Visit: Payer: Medicare Other | Admitting: Orthopaedic Surgery

## 2017-09-16 DIAGNOSIS — Z23 Encounter for immunization: Secondary | ICD-10-CM | POA: Diagnosis not present

## 2017-09-16 DIAGNOSIS — R7989 Other specified abnormal findings of blood chemistry: Secondary | ICD-10-CM | POA: Diagnosis not present

## 2017-09-21 ENCOUNTER — Encounter: Payer: Self-pay | Admitting: Orthopaedic Surgery

## 2017-09-21 ENCOUNTER — Ambulatory Visit: Payer: Medicare Other | Admitting: Orthopaedic Surgery

## 2017-09-21 ENCOUNTER — Ambulatory Visit (INDEPENDENT_AMBULATORY_CARE_PROVIDER_SITE_OTHER): Payer: Medicare Other | Admitting: Orthopaedic Surgery

## 2017-09-21 VITALS — BP 130/77 | HR 68 | Ht 70.0 in | Wt 315.0 lb

## 2017-09-21 DIAGNOSIS — G8929 Other chronic pain: Secondary | ICD-10-CM | POA: Diagnosis not present

## 2017-09-21 DIAGNOSIS — M25562 Pain in left knee: Secondary | ICD-10-CM

## 2017-09-21 NOTE — Progress Notes (Signed)
CC:  I have pain of my left knee. I would like an injection.  The patient has chronic pain of the left knee.  There is no recent trauma.  There is no redness.  Injections in the past have helped.  The knee has no redness, has an effusion and crepitus present.  ROM of the left knee is 0-100.  Impression:  Chronic knee pain left  Return: 1 month  PROCEDURE NOTE:  The patient requests injections of the left knee, verbal consent was obtained.  The left knee was prepped appropriately after time out was performed.   Sterile technique was observed and injection of 1 cc of Depo-Medrol 40 mg with several cc's of plain xylocaine. Anesthesia was provided by ethyl chloride and a 20-gauge needle was used to inject the knee area. The injection was tolerated well.  A band aid dressing was applied.  The patient was advised to apply ice later today and tomorrow to the injection sight as needed.  Electronically Signed Sanjuana Kava, MD 10/16/201810:57 AM
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
## 2017-10-07 DIAGNOSIS — L648 Other androgenic alopecia: Secondary | ICD-10-CM | POA: Diagnosis not present

## 2017-10-19 ENCOUNTER — Encounter: Payer: Self-pay | Admitting: Orthopaedic Surgery

## 2017-10-19 ENCOUNTER — Ambulatory Visit (INDEPENDENT_AMBULATORY_CARE_PROVIDER_SITE_OTHER): Payer: Medicare Other | Admitting: Orthopaedic Surgery

## 2017-10-19 DIAGNOSIS — M25562 Pain in left knee: Secondary | ICD-10-CM | POA: Diagnosis not present

## 2017-10-19 DIAGNOSIS — G8929 Other chronic pain: Secondary | ICD-10-CM | POA: Diagnosis not present

## 2017-10-19 NOTE — Patient Instructions (Signed)
Steps to Quit Smoking Smoking tobacco can be bad for your health. It can also affect almost every organ in your body. Smoking puts you and people around you at risk for many serious long-lasting (chronic) diseases. Quitting smoking is hard, but it is one of the best things that you can do for your health. It is never too late to quit. What are the benefits of quitting smoking? When you quit smoking, you lower your risk for getting serious diseases and conditions. They can include:  Lung cancer or lung disease.  Heart disease.  Stroke.  Heart attack.  Not being able to have children (infertility).  Weak bones (osteoporosis) and broken bones (fractures).  If you have coughing, wheezing, and shortness of breath, those symptoms may get better when you quit. You may also get sick less often. If you are pregnant, quitting smoking can help to lower your chances of having a baby of low birth weight. What can I do to help me quit smoking? Talk with your doctor about what can help you quit smoking. Some things you can do (strategies) include:  Quitting smoking totally, instead of slowly cutting back how much you smoke over a period of time.  Going to in-person counseling. You are more likely to quit if you go to many counseling sessions.  Using resources and support systems, such as: ? Online chats with a counselor. ? Phone quitlines. ? Printed self-help materials. ? Support groups or group counseling. ? Text messaging programs. ? Mobile phone apps or applications.  Taking medicines. Some of these medicines may have nicotine in them. If you are pregnant or breastfeeding, do not take any medicines to quit smoking unless your doctor says it is okay. Talk with your doctor about counseling or other things that can help you.  Talk with your doctor about using more than one strategy at the same time, such as taking medicines while you are also going to in-person counseling. This can help make  quitting easier. What things can I do to make it easier to quit? Quitting smoking might feel very hard at first, but there is a lot that you can do to make it easier. Take these steps:  Talk to your family and friends. Ask them to support and encourage you.  Call phone quitlines, reach out to support groups, or work with a counselor.  Ask people who smoke to not smoke around you.  Avoid places that make you want (trigger) to smoke, such as: ? Bars. ? Parties. ? Smoke-break areas at work.  Spend time with people who do not smoke.  Lower the stress in your life. Stress can make you want to smoke. Try these things to help your stress: ? Getting regular exercise. ? Deep-breathing exercises. ? Yoga. ? Meditating. ? Doing a body scan. To do this, close your eyes, focus on one area of your body at a time from head to toe, and notice which parts of your body are tense. Try to relax the muscles in those areas.  Download or buy apps on your mobile phone or tablet that can help you stick to your quit plan. There are many free apps, such as QuitGuide from the CDC (Centers for Disease Control and Prevention). You can find more support from smokefree.gov and other websites.  This information is not intended to replace advice given to you by your health care provider. Make sure you discuss any questions you have with your health care provider. Document Released: 09/19/2009 Document   Revised: 07/21/2016 Document Reviewed: 04/09/2015 Elsevier Interactive Patient Education  2018 Elsevier Inc.  

## 2017-10-19 NOTE — Progress Notes (Signed)
CC:  I have pain of my left knee. I would like an injection.  The patient has chronic pain of the left knee.  There is no recent trauma.  There is no redness.  Injections in the past have helped.  The knee has no redness, has an effusion and crepitus present.  ROM of the left knee is 0-105.  Impression:  Chronic knee pain left  Return: 1 month  PROCEDURE NOTE:  The patient requests injections of the left knee, verbal consent was obtained.  The left knee was prepped appropriately after time out was performed.   Sterile technique was observed and injection of 1 cc of Depo-Medrol 40 mg with several cc's of plain xylocaine. Anesthesia was provided by ethyl chloride and a 20-gauge needle was used to inject the knee area. The injection was tolerated well.  A band aid dressing was applied.  The patient was advised to apply ice later today and tomorrow to the injection sight as needed.  Electronically Signed Sanjuana Kava, MD 11/13/20181:53 PM

## 2017-10-22 DIAGNOSIS — R7989 Other specified abnormal findings of blood chemistry: Secondary | ICD-10-CM | POA: Diagnosis not present

## 2017-11-17 ENCOUNTER — Encounter: Payer: Self-pay | Admitting: Orthopaedic Surgery

## 2017-11-17 ENCOUNTER — Ambulatory Visit (INDEPENDENT_AMBULATORY_CARE_PROVIDER_SITE_OTHER): Payer: Medicare Other | Admitting: Orthopaedic Surgery

## 2017-11-17 DIAGNOSIS — G8929 Other chronic pain: Secondary | ICD-10-CM

## 2017-11-17 DIAGNOSIS — M25562 Pain in left knee: Secondary | ICD-10-CM | POA: Diagnosis not present

## 2017-11-17 NOTE — Progress Notes (Signed)
CC:  I have pain of my left knee. I would like an injection.  The patient has chronic pain of the left knee.  There is no recent trauma.  There is no redness.  Injections in the past have helped.  The knee has no redness, has an effusion and crepitus present.  ROM of the left knee is 0-100.  Impression:  Chronic knee pain left  Return: 1 month  PROCEDURE NOTE:  The patient requests injections of the left knee, verbal consent was obtained.  The left knee was prepped appropriately after time out was performed.   Sterile technique was observed and injection of 1 cc of Depo-Medrol 40 mg with several cc's of plain xylocaine. Anesthesia was provided by ethyl chloride and a 20-gauge needle was used to inject the knee area. The injection was tolerated well.  A band aid dressing was applied.  The patient was advised to apply ice later today and tomorrow to the injection sight as needed.  Electronically Signed Sanjuana Kava, MD 12/12/201810:43 AM

## 2017-11-23 DIAGNOSIS — R7989 Other specified abnormal findings of blood chemistry: Secondary | ICD-10-CM | POA: Diagnosis not present

## 2017-12-15 ENCOUNTER — Ambulatory Visit: Payer: Medicare Other | Admitting: Orthopaedic Surgery

## 2018-01-10 DIAGNOSIS — R7989 Other specified abnormal findings of blood chemistry: Secondary | ICD-10-CM | POA: Diagnosis not present

## 2018-01-17 DIAGNOSIS — L820 Inflamed seborrheic keratosis: Secondary | ICD-10-CM | POA: Diagnosis not present

## 2018-01-24 DIAGNOSIS — M170 Bilateral primary osteoarthritis of knee: Secondary | ICD-10-CM | POA: Diagnosis not present

## 2018-01-26 ENCOUNTER — Encounter: Payer: Self-pay | Admitting: Orthopaedic Surgery

## 2018-01-26 ENCOUNTER — Ambulatory Visit (INDEPENDENT_AMBULATORY_CARE_PROVIDER_SITE_OTHER): Payer: Medicare Other | Admitting: Orthopaedic Surgery

## 2018-01-26 VITALS — BP 169/75 | HR 81 | Temp 97.8°F | Ht 70.0 in

## 2018-01-26 DIAGNOSIS — M25562 Pain in left knee: Secondary | ICD-10-CM

## 2018-01-26 DIAGNOSIS — G8929 Other chronic pain: Secondary | ICD-10-CM | POA: Diagnosis not present

## 2018-01-26 MED ORDER — DICLOFENAC SODIUM 75 MG PO TBEC: 75.0000 mg | DELAYED_RELEASE_TABLET | Freq: Two times a day (BID) | ORAL | 2 refills | Status: DC

## 2018-01-26 NOTE — Progress Notes (Signed)
CC:  I have pain of my left knee. I would like an injection.  The patient has chronic pain of the left knee.  There is no recent trauma.  There is no redness.  Injections in the past have helped.  The knee has no redness, has an effusion and crepitus present.  ROM of the left knee is 0-110.  Impression:  Chronic knee pain left  Return: 3 months  PROCEDURE NOTE:  The patient requests injections of the left knee, verbal consent was obtained.  The left knee was prepped appropriately after time out was performed.   Sterile technique was observed and injection of 1 cc of Depo-Medrol 40 mg with several cc's of plain xylocaine. Anesthesia was provided by ethyl chloride and a 20-gauge needle was used to inject the knee area. The injection was tolerated well.  A band aid dressing was applied.  The patient was advised to apply ice later today and tomorrow to the injection sight as needed.  Electronically Signed Sanjuana Kava, MD 2/20/20191:42 PM

## 2018-01-27 DIAGNOSIS — M1712 Unilateral primary osteoarthritis, left knee: Secondary | ICD-10-CM | POA: Diagnosis not present

## 2018-03-15 DIAGNOSIS — R7989 Other specified abnormal findings of blood chemistry: Secondary | ICD-10-CM | POA: Diagnosis not present

## 2018-03-21 DIAGNOSIS — Z1389 Encounter for screening for other disorder: Secondary | ICD-10-CM | POA: Diagnosis not present

## 2018-03-21 DIAGNOSIS — M1990 Unspecified osteoarthritis, unspecified site: Secondary | ICD-10-CM | POA: Diagnosis not present

## 2018-03-21 DIAGNOSIS — R2232 Localized swelling, mass and lump, left upper limb: Secondary | ICD-10-CM | POA: Diagnosis not present

## 2018-04-13 DIAGNOSIS — E782 Mixed hyperlipidemia: Secondary | ICD-10-CM | POA: Diagnosis not present

## 2018-04-13 DIAGNOSIS — R7309 Other abnormal glucose: Secondary | ICD-10-CM | POA: Diagnosis not present

## 2018-04-13 DIAGNOSIS — Z1389 Encounter for screening for other disorder: Secondary | ICD-10-CM | POA: Diagnosis not present

## 2018-04-13 DIAGNOSIS — E291 Testicular hypofunction: Secondary | ICD-10-CM | POA: Diagnosis not present

## 2018-04-13 DIAGNOSIS — Z0000 Encounter for general adult medical examination without abnormal findings: Secondary | ICD-10-CM | POA: Diagnosis not present

## 2018-04-27 ENCOUNTER — Encounter: Payer: Self-pay | Admitting: Orthopaedic Surgery

## 2018-04-27 ENCOUNTER — Ambulatory Visit (INDEPENDENT_AMBULATORY_CARE_PROVIDER_SITE_OTHER): Payer: Medicare Other | Admitting: Orthopaedic Surgery

## 2018-04-27 VITALS — Ht 70.0 in

## 2018-04-27 DIAGNOSIS — G8929 Other chronic pain: Secondary | ICD-10-CM | POA: Diagnosis not present

## 2018-04-27 DIAGNOSIS — M25562 Pain in left knee: Secondary | ICD-10-CM

## 2018-04-27 DIAGNOSIS — R7989 Other specified abnormal findings of blood chemistry: Secondary | ICD-10-CM | POA: Diagnosis not present

## 2018-04-27 NOTE — Progress Notes (Signed)
CC:  I have pain of my left knee. I would like an injection.  The patient has chronic pain of the left knee.  There is no recent trauma.  There is no redness.  Injections in the past have helped.  The knee has no redness, has an effusion and crepitus present.  ROM of the left knee is 0-95.He has a knee brace  Impression:  Chronic knee pain left  Return: 3 months  PROCEDURE NOTE:  The patient requests injections of the left knee, verbal consent was obtained.  The left knee was prepped appropriately after time out was performed.   Sterile technique was observed and injection of 1 cc of Depo-Medrol 40 mg with several cc's of plain xylocaine. Anesthesia was provided by ethyl chloride and a 20-gauge needle was used to inject the knee area. The injection was tolerated well.  A band aid dressing was applied.  The patient was advised to apply ice later today and tomorrow to the injection sight as needed.  Electronically Signed Sanjuana Kava, MD 5/22/20191:47 PM

## 2018-05-12 DIAGNOSIS — R7989 Other specified abnormal findings of blood chemistry: Secondary | ICD-10-CM | POA: Diagnosis not present

## 2018-05-27 DIAGNOSIS — R7989 Other specified abnormal findings of blood chemistry: Secondary | ICD-10-CM | POA: Diagnosis not present

## 2018-06-13 DIAGNOSIS — R7989 Other specified abnormal findings of blood chemistry: Secondary | ICD-10-CM | POA: Diagnosis not present

## 2018-07-01 DIAGNOSIS — R7989 Other specified abnormal findings of blood chemistry: Secondary | ICD-10-CM | POA: Diagnosis not present

## 2018-07-27 ENCOUNTER — Encounter: Payer: Self-pay | Admitting: Orthopaedic Surgery

## 2018-07-27 ENCOUNTER — Ambulatory Visit (INDEPENDENT_AMBULATORY_CARE_PROVIDER_SITE_OTHER): Payer: Medicare Other | Admitting: Orthopaedic Surgery

## 2018-07-27 VITALS — Wt 315.4 lb

## 2018-07-27 DIAGNOSIS — G8929 Other chronic pain: Secondary | ICD-10-CM | POA: Diagnosis not present

## 2018-07-27 DIAGNOSIS — M25562 Pain in left knee: Secondary | ICD-10-CM

## 2018-07-27 DIAGNOSIS — Z6841 Body Mass Index (BMI) 40.0 and over, adult: Secondary | ICD-10-CM

## 2018-07-27 NOTE — Progress Notes (Signed)
CC:  I have pain of my left knee. I would like an injection.  The patient has chronic pain of the left knee.  There is no recent trauma.  There is no redness.  Injections in the past have helped.  The knee has no redness, has an effusion and crepitus present.  ROM of the left knee is 0-110.  Impression:  Chronic knee pain left  Return: 1 month  PROCEDURE NOTE:  The patient requests injections of the left knee, verbal consent was obtained.  The left knee was prepped appropriately after time out was performed.   Sterile technique was observed and injection of 1 cc of Depo-Medrol 40 mg with several cc's of plain xylocaine. Anesthesia was provided by ethyl chloride and a 20-gauge needle was used to inject the knee area. The injection was tolerated well.  A band aid dressing was applied.  The patient was advised to apply ice later today and tomorrow to the injection sight as needed.  The patient meets the AMA guidelines for Morbid (severe) obesity with a BMI > 40.0 and I have recommended weight loss.  Encounter Diagnoses  Name Primary?  . Chronic pain of left knee Yes  . Body mass index 40.0-44.9, adult ([place])   . Morbid obesity ([place])    I will see in one month.   Electronically Signed Sanjuana Kava, MD 8/21/201910:36 AM

## 2018-07-28 ENCOUNTER — Ambulatory Visit: Payer: Medicare Other | Admitting: Orthopaedic Surgery

## 2018-08-04 DIAGNOSIS — R7989 Other specified abnormal findings of blood chemistry: Secondary | ICD-10-CM | POA: Diagnosis not present

## 2018-08-19 DIAGNOSIS — Z23 Encounter for immunization: Secondary | ICD-10-CM | POA: Diagnosis not present

## 2018-08-19 DIAGNOSIS — R7989 Other specified abnormal findings of blood chemistry: Secondary | ICD-10-CM | POA: Diagnosis not present

## 2018-08-24 ENCOUNTER — Ambulatory Visit (INDEPENDENT_AMBULATORY_CARE_PROVIDER_SITE_OTHER): Payer: Medicare Other | Admitting: Orthopaedic Surgery

## 2018-08-24 ENCOUNTER — Encounter: Payer: Self-pay | Admitting: Orthopaedic Surgery

## 2018-08-24 VITALS — BP 130/76 | HR 71 | Ht 70.0 in | Wt 313.0 lb

## 2018-08-24 DIAGNOSIS — G8929 Other chronic pain: Secondary | ICD-10-CM

## 2018-08-24 DIAGNOSIS — M25562 Pain in left knee: Secondary | ICD-10-CM

## 2018-08-24 DIAGNOSIS — Z6841 Body Mass Index (BMI) 40.0 and over, adult: Secondary | ICD-10-CM

## 2018-08-24 NOTE — Progress Notes (Signed)
CC:  I have pain of my left knee. I would like an injection.  The patient has chronic pain of the left knee.  There is no recent trauma.  There is no redness.  Injections in the past have helped.  The knee has no redness, has an effusion and crepitus present.  ROM of the left knee is 0-110.  Impression:  Chronic knee pain left  Return: 1 month  PROCEDURE NOTE:  The patient requests injections of the left knee, verbal consent was obtained.  The left knee was prepped appropriately after time out was performed.   Sterile technique was observed and injection of 1 cc of Depo-Medrol 40 mg with several cc's of plain xylocaine. Anesthesia was provided by ethyl chloride and a 20-gauge needle was used to inject the knee area. The injection was tolerated well.  A band aid dressing was applied.  The patient was advised to apply ice later today and tomorrow to the injection sight as needed.  The patient meets the AMA guidelines for Morbid (severe) obesity with a BMI > 40.0 and I have recommended weight loss.  Electronically Signed Sanjuana Kava, MD 9/18/20192:12 PM

## 2018-09-20 ENCOUNTER — Encounter: Payer: Self-pay | Admitting: Orthopaedic Surgery

## 2018-09-20 ENCOUNTER — Ambulatory Visit (INDEPENDENT_AMBULATORY_CARE_PROVIDER_SITE_OTHER): Payer: Medicare Other | Admitting: Orthopaedic Surgery

## 2018-09-20 DIAGNOSIS — G8929 Other chronic pain: Secondary | ICD-10-CM | POA: Diagnosis not present

## 2018-09-20 DIAGNOSIS — Z6841 Body Mass Index (BMI) 40.0 and over, adult: Secondary | ICD-10-CM

## 2018-09-20 DIAGNOSIS — M25562 Pain in left knee: Secondary | ICD-10-CM

## 2018-09-20 NOTE — Progress Notes (Signed)
CC:  I have pain of my left knee. I would like an injection.  The patient has chronic pain of the left knee.  There is no recent trauma.  There is no redness.  Injections in the past have helped.  The knee has no redness, has an effusion and crepitus present.  ROM of the left knee is 0-105.  Impression:  Chronic knee pain left  Return: 1 month  PROCEDURE NOTE:  The patient requests injections of the left knee, verbal consent was obtained.  The left knee was prepped appropriately after time out was performed.   Sterile technique was observed and injection of 1 cc of Depo-Medrol 40 mg with several cc's of plain xylocaine. Anesthesia was provided by ethyl chloride and a 20-gauge needle was used to inject the knee area. The injection was tolerated well.  A band aid dressing was applied.  The patient was advised to apply ice later today and tomorrow to the injection sight as needed.  Electronically Signed Sanjuana Kava, MD 10/15/20192:00 PM

## 2018-09-21 ENCOUNTER — Ambulatory Visit: Payer: Medicare Other | Admitting: Orthopaedic Surgery

## 2018-09-21 DIAGNOSIS — R7989 Other specified abnormal findings of blood chemistry: Secondary | ICD-10-CM | POA: Diagnosis not present

## 2018-10-14 DIAGNOSIS — R7989 Other specified abnormal findings of blood chemistry: Secondary | ICD-10-CM | POA: Diagnosis not present

## 2018-10-18 ENCOUNTER — Ambulatory Visit: Payer: Medicare Other | Admitting: Orthopaedic Surgery

## 2018-10-18 ENCOUNTER — Encounter: Payer: Self-pay | Admitting: Orthopaedic Surgery

## 2018-10-20 ENCOUNTER — Encounter: Payer: Self-pay | Admitting: Orthopaedic Surgery

## 2018-10-20 ENCOUNTER — Ambulatory Visit (INDEPENDENT_AMBULATORY_CARE_PROVIDER_SITE_OTHER): Payer: Medicare Other | Admitting: Orthopaedic Surgery

## 2018-10-20 DIAGNOSIS — M25562 Pain in left knee: Secondary | ICD-10-CM

## 2018-10-20 DIAGNOSIS — Z6841 Body Mass Index (BMI) 40.0 and over, adult: Secondary | ICD-10-CM

## 2018-10-20 DIAGNOSIS — G8929 Other chronic pain: Secondary | ICD-10-CM | POA: Diagnosis not present

## 2018-10-20 NOTE — Progress Notes (Signed)
CC:  I have pain of my left knee. I would like an injection.  The patient has chronic pain of the left knee.  There is no recent trauma.  There is no redness.  Injections in the past have helped.  The knee has no redness, has an effusion and crepitus present.  ROM of the left knee is 0-105.  Impression:  Chronic knee pain left  Return: 1 month  PROCEDURE NOTE:  The patient requests injections of the left knee, verbal consent was obtained.  The left knee was prepped appropriately after time out was performed.   Sterile technique was observed and injection of 1 cc of Depo-Medrol 40 mg with several cc's of plain xylocaine. Anesthesia was provided by ethyl chloride and a 20-gauge needle was used to inject the knee area. The injection was tolerated well.  A band aid dressing was applied.  The patient was advised to apply ice later today and tomorrow to the injection sight as needed.  Electronically Signed Sanjuana Kava, MD 11/14/201910:04 AM

## 2018-11-15 ENCOUNTER — Ambulatory Visit: Payer: Medicare Other | Admitting: Orthopaedic Surgery

## 2018-11-17 ENCOUNTER — Encounter: Payer: Self-pay | Admitting: Orthopaedic Surgery

## 2018-11-17 ENCOUNTER — Ambulatory Visit (INDEPENDENT_AMBULATORY_CARE_PROVIDER_SITE_OTHER): Payer: Medicare Other | Admitting: Orthopaedic Surgery

## 2018-11-17 DIAGNOSIS — M25562 Pain in left knee: Secondary | ICD-10-CM

## 2018-11-17 DIAGNOSIS — G8929 Other chronic pain: Secondary | ICD-10-CM

## 2018-11-17 DIAGNOSIS — Z6841 Body Mass Index (BMI) 40.0 and over, adult: Secondary | ICD-10-CM

## 2018-11-17 NOTE — Progress Notes (Signed)
CC:  I have pain of my left knee. I would like an injection.  The patient has chronic pain of the left knee.  There is no recent trauma.  There is no redness.  Injections in the past have helped.  The knee has no redness, has an effusion and crepitus present.  ROM of the left knee is 0-100.  Impression:  Chronic knee pain left  Return: 1 month  PROCEDURE NOTE:  The patient requests injections of the left knee, verbal consent was obtained.  The left knee was prepped appropriately after time out was performed.   Sterile technique was observed and injection of 1 cc of Depo-Medrol 40 mg with several cc's of plain xylocaine. Anesthesia was provided by ethyl chloride and a 20-gauge needle was used to inject the knee area. The injection was tolerated well.  A band aid dressing was applied.  The patient was advised to apply ice later today and tomorrow to the injection sight as needed.  Electronically Signed Sanjuana Kava, MD 12/12/20199:34 AM

## 2018-11-23 DIAGNOSIS — R7989 Other specified abnormal findings of blood chemistry: Secondary | ICD-10-CM | POA: Diagnosis not present

## 2018-12-20 ENCOUNTER — Ambulatory Visit (INDEPENDENT_AMBULATORY_CARE_PROVIDER_SITE_OTHER): Payer: Medicare Other

## 2018-12-20 ENCOUNTER — Ambulatory Visit (INDEPENDENT_AMBULATORY_CARE_PROVIDER_SITE_OTHER): Payer: Medicare Other | Admitting: Orthopaedic Surgery

## 2018-12-20 ENCOUNTER — Encounter: Payer: Self-pay | Admitting: Orthopaedic Surgery

## 2018-12-20 VITALS — BP 149/74 | HR 68 | Ht 70.5 in | Wt 317.0 lb

## 2018-12-20 DIAGNOSIS — M25511 Pain in right shoulder: Secondary | ICD-10-CM | POA: Diagnosis not present

## 2018-12-20 DIAGNOSIS — Z6841 Body Mass Index (BMI) 40.0 and over, adult: Secondary | ICD-10-CM | POA: Diagnosis not present

## 2018-12-20 DIAGNOSIS — G8929 Other chronic pain: Secondary | ICD-10-CM

## 2018-12-20 DIAGNOSIS — R7989 Other specified abnormal findings of blood chemistry: Secondary | ICD-10-CM | POA: Diagnosis not present

## 2018-12-20 NOTE — Progress Notes (Signed)
Patient Jon Whitney, male DOB:06/28/1946, 73 y.o. LGX:[id number]  Chief Complaint  Patient presents with  . Knee Pain    left    HPI  Jon Whitney is a 73 y.o. male who has developed pain in the right shoulder.  He had an injury about five to six years ago. It bothers him now and then but has gotten worse over the last two weeks.  He has pain with overhead use.  He has no numbness, no new trauma, no redness.  He says he cannot sleep well secondary to the pain.   Body mass index is 44.84 kg/m.  The patient meets the AMA guidelines for Morbid (severe) obesity with a BMI > 40.0 and I have recommended weight loss.   ROS  Review of Systems  Constitutional:       Patient does not have Diabetes Mellitus. Patient has hypertension. Patient has COPD or shortness of breath. Patient has BMI > 35. Patient does not have current smoking history.  HENT: Negative for congestion.   Respiratory: Positive for cough, shortness of breath and wheezing.   Cardiovascular: Negative for chest pain.  Gastrointestinal:       GERD  Endocrine: Positive for cold intolerance.  Musculoskeletal: Positive for arthralgias, gait problem, joint swelling and myalgias.  Allergic/Immunologic: Positive for environmental allergies.  Neurological: Negative for numbness.  Psychiatric/Behavioral: The patient is nervous/anxious.     All other systems reviewed and are negative.  The following is a summary of the past history medically, past history surgically, known current medicines, social history and family history.  This information is gathered electronically by the computer from prior information and documentation.  I review this each visit and have found including this information at this point in the chart is beneficial and informative.    Past Medical History:  Diagnosis Date  . Anxiety   . Arthritis   . Asthma   . 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 GERD (gastroesophageal reflux disease)   . H/O hiatal hernia   . Heart murmur    as a child "it went away"  . Hypertension   . Psychiatric disorder   . PTSD (post-traumatic stress disorder)   . Sleep apnea    uses cpap    Past Surgical History:  Procedure Laterality Date  . COLONOSCOPY    . FOOT SURGERY Right   . HAND SURGERY Right    finger amputations  . MOUTH SURGERY     prosthetic soft palate    Family History  Problem Relation Age of Onset  . Asthma Mother   . Pneumonia Mother   . Bone cancer Father     Social History Social History   Tobacco Use  . Smoking status: Former Smoker    Last attempt to quit: 08/28/1967    Years since quitting: 51.3  . Smokeless tobacco: Never Used  Substance Use Topics  . Alcohol use: Yes    Comment: occasionally  . Drug use: No    Allergies  Allergen Reactions  . Penicillins Hives and Rash    Current Outpatient Medications  Medication Sig Dispense Refill  . acetaminophen (TYLENOL) 325 MG tablet     . albuterol (PROVENTIL HFA;VENTOLIN HFA) 108 (90 BASE) MCG/ACT inhaler Inhale 1 puff into the lungs every  6 (six) hours as needed for wheezing or shortness of breath.    . ALPRAZolam (XANAX) 0.25 MG tablet     . ALPRAZolam (XANAX) 0.5 MG tablet Take 0.5 mg by mouth 3 (three) times daily.    Marland Kitchen amLODipine (NORVASC) 10 MG tablet Take 10 mg by mouth daily.    . cetirizine (ZYRTEC) 10 MG tablet Take 10 mg by mouth daily.    . diclofenac (VOLTAREN) 75 MG EC tablet Take 1 tablet (75 mg total) by mouth 2 (two) times daily with a meal. 60 tablet 2  . docusate sodium (COLACE) 100 MG capsule Take 200 mg by mouth daily.    . fluticasone (FLONASE) 50 MCG/ACT nasal spray Place 2 sprays into the nose daily.    . hydrOXYzine (ATARAX/VISTARIL) 25 MG tablet Take 50 mg by mouth 3  (three) times daily.    . Menthol-Methyl Salicylate (THERA-GESIC) 1-15 % CREA     . methocarbamol (ROBAXIN) 500 MG tablet Take 500 mg by mouth 3 (three) times daily as needed for muscle spasms.    . Multiple Vitamin (MULTIVITAMIN WITH MINERALS) TABS tablet Take 1 tablet by mouth daily.    Marland Kitchen omeprazole (PRILOSEC) 20 MG capsule Take 20 mg by mouth 3 (three) times daily before meals.    . ondansetron (ZOFRAN ODT) 8 MG disintegrating tablet Take 1 tablet (8 mg total) by mouth every 8 (eight) hours as needed for nausea or vomiting. 10 tablet 0  . pravastatin (PRAVACHOL) 40 MG tablet Take 20 mg by mouth at bedtime.    . sildenafil (VIAGRA) 100 MG tablet     . traMADol (ULTRAM) 50 MG tablet Take 1 tablet (50 mg total) by mouth every 6 (six) hours as needed. 60 tablet 3   No current facility-administered medications for this visit.      Physical Exam  Blood pressure (!) 149/74, pulse 68, height 5' 10.5" (1.791 m), weight (!) 317 lb (143.8 kg).  Constitutional: overall normal hygiene, normal nutrition, well developed, normal grooming, normal body habitus. Assistive device:brace left knee  Musculoskeletal: gait and station Limp left, muscle tone and strength are normal, no tremors or atrophy is present.  .  Neurological: coordination overall normal.  Deep tendon reflex/nerve stretch intact.  Sensation normal.  Cranial nerves II-XII intact.   Skin:   Normal overall no scars, lesions, ulcers or rashes. No psoriasis.  Psychiatric: Alert and oriented x 3.  Recent memory intact, remote memory unclear.  Normal mood and affect. Well groomed.  Good eye contact.  Cardiovascular: overall no swelling, no varicosities, no edema bilaterally, normal temperatures of the legs and arms, no clubbing, cyanosis and good capillary refill.  Lymphatic: palpation is normal.  Examination of right Upper Extremity is done.  Inspection:   Overall:  Elbow non-tender without crepitus or defects, forearm non-tender  without crepitus or defects, wrist non-tender without crepitus or defects, hand non-tender.    Shoulder: with glenohumeral joint tenderness, without effusion.   Upper arm: without swelling and tenderness   Range of motion:   Overall:  Full range of motion of the elbow, full range of motion of wrist and full range of motion in fingers.   Shoulder:  right  165 degrees forward flexion; 150 degrees abduction; 35 degrees internal rotation, 35 degrees external rotation, 15 degrees extension, 40 degrees adduction.   Stability:   Overall:  Shoulder, elbow and wrist stable   Strength and Tone:   Overall full shoulder muscles strength, full upper arm strength and normal  upper arm bulk and tone.  All other systems reviewed and are negative   The patient has been educated about the nature of the problem(s) and counseled on treatment options.  The patient appeared to understand what I have discussed and is in agreement with it.  Encounter Diagnosis  Name Primary?  . Chronic right shoulder pain Yes    PLAN Call if any problems.  Precautions discussed.  Continue current medications.   Return to clinic 2 weeks   PROCEDURE NOTE:  The patient request injection, verbal consent was obtained.  The right shoulder was prepped appropriately after time out was performed.   Sterile technique was observed and injection of 1 cc of Depo-Medrol 40 mg with several cc's of plain xylocaine. Anesthesia was provided by ethyl chloride and a 20-gauge needle was used to inject the shoulder area. A posterior approach was used.  The injection was tolerated well.  A band aid dressing was applied.  The patient was advised to apply ice later today and tomorrow to the injection sight as needed.  Electronically Signed Sanjuana Kava, MD 1/14/20202:49 PM

## 2019-01-03 ENCOUNTER — Encounter: Payer: Self-pay | Admitting: Orthopaedic Surgery

## 2019-01-03 ENCOUNTER — Ambulatory Visit (INDEPENDENT_AMBULATORY_CARE_PROVIDER_SITE_OTHER): Payer: Medicare Other | Admitting: Orthopaedic Surgery

## 2019-01-03 DIAGNOSIS — M25562 Pain in left knee: Secondary | ICD-10-CM | POA: Diagnosis not present

## 2019-01-03 DIAGNOSIS — Z6841 Body Mass Index (BMI) 40.0 and over, adult: Secondary | ICD-10-CM

## 2019-01-03 DIAGNOSIS — G8929 Other chronic pain: Secondary | ICD-10-CM

## 2019-01-03 NOTE — Progress Notes (Signed)
CC:  I have pain of my left knee. I would like an injection.  The patient has chronic pain of the left knee.  There is no recent trauma.  There is no redness.  Injections in the past have helped.  The knee has no redness, has an effusion and crepitus present.  ROM of the left knee is 0-105.  Impression:  Chronic knee pain left  Return: 1 month  PROCEDURE NOTE:  The patient requests injections of the left knee, verbal consent was obtained.  The left knee was prepped appropriately after time out was performed.   Sterile technique was observed and injection of 1 cc of Depo-Medrol 40 mg with several cc's of plain xylocaine. Anesthesia was provided by ethyl chloride and a 20-gauge needle was used to inject the knee area. The injection was tolerated well.  A band aid dressing was applied.  The patient was advised to apply ice later today and tomorrow to the injection sight as needed.  Electronically Signed Sanjuana Kava, MD 1/28/20203:37 PM

## 2019-01-09 DIAGNOSIS — I959 Hypotension, unspecified: Secondary | ICD-10-CM | POA: Diagnosis not present

## 2019-01-09 DIAGNOSIS — R7309 Other abnormal glucose: Secondary | ICD-10-CM | POA: Diagnosis not present

## 2019-01-09 DIAGNOSIS — Z1389 Encounter for screening for other disorder: Secondary | ICD-10-CM | POA: Diagnosis not present

## 2019-01-17 ENCOUNTER — Ambulatory Visit: Payer: Medicare Other | Admitting: Orthopaedic Surgery

## 2019-01-30 DIAGNOSIS — R7989 Other specified abnormal findings of blood chemistry: Secondary | ICD-10-CM | POA: Diagnosis not present

## 2019-02-01 ENCOUNTER — Encounter: Payer: Self-pay | Admitting: Orthopaedic Surgery

## 2019-02-01 ENCOUNTER — Ambulatory Visit (INDEPENDENT_AMBULATORY_CARE_PROVIDER_SITE_OTHER): Payer: Medicare Other | Admitting: Orthopaedic Surgery

## 2019-02-01 DIAGNOSIS — G8929 Other chronic pain: Secondary | ICD-10-CM

## 2019-02-01 DIAGNOSIS — M25562 Pain in left knee: Secondary | ICD-10-CM

## 2019-02-01 NOTE — Progress Notes (Signed)
PROCEDURE NOTE:  The patient requests injections of the left knee , verbal consent was obtained.  The left knee was prepped appropriately after time out was performed.   Sterile technique was observed and injection of 1 cc of Depo-Medrol 40 mg with several cc's of plain xylocaine. Anesthesia was provided by ethyl chloride and a 20-gauge needle was used to inject the knee area. The injection was tolerated well.  A band aid dressing was applied.  The patient was advised to apply ice later today and tomorrow to the injection sight as needed.  Electronically Signed Sanjuana Kava, MD 2/26/20202:01 PM

## 2019-03-07 ENCOUNTER — Ambulatory Visit: Payer: Medicare Other | Admitting: Orthopaedic Surgery

## 2019-04-04 ENCOUNTER — Encounter: Payer: Self-pay | Admitting: Orthopaedic Surgery

## 2019-04-04 ENCOUNTER — Ambulatory Visit (INDEPENDENT_AMBULATORY_CARE_PROVIDER_SITE_OTHER): Payer: Medicare Other | Admitting: Orthopaedic Surgery

## 2019-04-04 ENCOUNTER — Other Ambulatory Visit: Payer: Self-pay

## 2019-04-04 VITALS — Ht 70.5 in | Wt 317.0 lb

## 2019-04-04 DIAGNOSIS — Z6841 Body Mass Index (BMI) 40.0 and over, adult: Secondary | ICD-10-CM

## 2019-04-04 DIAGNOSIS — G8929 Other chronic pain: Secondary | ICD-10-CM

## 2019-04-04 DIAGNOSIS — M25562 Pain in left knee: Secondary | ICD-10-CM

## 2019-04-04 NOTE — Progress Notes (Signed)
CC:  I have pain of my left knee. I would like an injection.  The patient has chronic pain of the left knee.  There is no recent trauma.  There is no redness.  Injections in the past have helped.  The knee has no redness, has an effusion and crepitus present.  ROM of the left knee is 0-*110.  Impression:  Chronic knee pain left  Return: prn  PROCEDURE NOTE:  The patient requests injections of the left knee, verbal consent was obtained.  The left knee was prepped appropriately after time out was performed.   Sterile technique was observed and injection of 1 cc of Depo-Medrol 40 mg with several cc's of plain xylocaine. Anesthesia was provided by ethyl chloride and a 20-gauge needle was used to inject the knee area. The injection was tolerated well.  A band aid dressing was applied.  The patient was advised to apply ice later today and tomorrow to the injection sight as needed.  Electronically Signed Sanjuana Kava, MD 4/28/20209:50 AM

## 2019-04-20 DIAGNOSIS — E7849 Other hyperlipidemia: Secondary | ICD-10-CM | POA: Diagnosis not present

## 2019-04-20 DIAGNOSIS — Z1389 Encounter for screening for other disorder: Secondary | ICD-10-CM | POA: Diagnosis not present

## 2019-04-20 DIAGNOSIS — Z0001 Encounter for general adult medical examination with abnormal findings: Secondary | ICD-10-CM | POA: Diagnosis not present

## 2019-05-08 DIAGNOSIS — R7989 Other specified abnormal findings of blood chemistry: Secondary | ICD-10-CM | POA: Diagnosis not present

## 2019-05-22 DIAGNOSIS — R7989 Other specified abnormal findings of blood chemistry: Secondary | ICD-10-CM | POA: Diagnosis not present

## 2019-06-07 DIAGNOSIS — R7989 Other specified abnormal findings of blood chemistry: Secondary | ICD-10-CM | POA: Diagnosis not present

## 2019-06-15 ENCOUNTER — Other Ambulatory Visit: Payer: Self-pay

## 2019-06-15 ENCOUNTER — Encounter: Payer: Self-pay | Admitting: Orthopaedic Surgery

## 2019-06-15 ENCOUNTER — Ambulatory Visit (INDEPENDENT_AMBULATORY_CARE_PROVIDER_SITE_OTHER): Payer: Medicare Other | Admitting: Orthopaedic Surgery

## 2019-06-15 DIAGNOSIS — M25562 Pain in left knee: Secondary | ICD-10-CM

## 2019-06-15 DIAGNOSIS — G8929 Other chronic pain: Secondary | ICD-10-CM | POA: Diagnosis not present

## 2019-06-15 NOTE — Progress Notes (Signed)
CC:  I have pain of my left knee. I would like an injection.  The patient has chronic pain of the left knee.  There is no recent trauma.  There is no redness.  Injections in the past have helped.  The knee has no redness, has an effusion and crepitus present.  ROM of the left knee is 0-110.  Impression:  Chronic knee pain left  Return: 6 weeks  PROCEDURE NOTE:  The patient requests injections of the left knee, verbal consent was obtained.  The left knee was prepped appropriately after time out was performed.   Sterile technique was observed and injection of 1 cc of Depo-Medrol 40 mg with several cc's of plain xylocaine. Anesthesia was provided by ethyl chloride and a 20-gauge needle was used to inject the knee area. The injection was tolerated well.  A band aid dressing was applied.  The patient was advised to apply ice later today and tomorrow to the injection sight as needed.  Electronically Signed Sanjuana Kava, MD 7/9/202011:14 AM

## 2019-06-22 DIAGNOSIS — R7989 Other specified abnormal findings of blood chemistry: Secondary | ICD-10-CM | POA: Diagnosis not present

## 2019-07-07 DIAGNOSIS — R7989 Other specified abnormal findings of blood chemistry: Secondary | ICD-10-CM | POA: Diagnosis not present

## 2019-07-27 ENCOUNTER — Ambulatory Visit (INDEPENDENT_AMBULATORY_CARE_PROVIDER_SITE_OTHER): Payer: Medicare Other | Admitting: Orthopaedic Surgery

## 2019-07-27 ENCOUNTER — Encounter: Payer: Self-pay | Admitting: Orthopaedic Surgery

## 2019-07-27 ENCOUNTER — Other Ambulatory Visit: Payer: Self-pay

## 2019-07-27 DIAGNOSIS — G8929 Other chronic pain: Secondary | ICD-10-CM

## 2019-07-27 DIAGNOSIS — Z6841 Body Mass Index (BMI) 40.0 and over, adult: Secondary | ICD-10-CM

## 2019-07-27 DIAGNOSIS — M25562 Pain in left knee: Secondary | ICD-10-CM | POA: Diagnosis not present

## 2019-07-27 NOTE — Progress Notes (Signed)
PROCEDURE NOTE:  The patient requests injections of the left knee , verbal consent was obtained.  The left knee was prepped appropriately after time out was performed.   Sterile technique was observed and injection of 1 cc of Depo-Medrol 40 mg with several cc's of plain xylocaine. Anesthesia was provided by ethyl chloride and a 20-gauge needle was used to inject the knee area. The injection was tolerated well.  A band aid dressing was applied.  The patient was advised to apply ice later today and tomorrow to the injection sight as needed.  He wants to talk about total knee on the left.  I will have him see Dr. Aline Brochure.  Electronically Signed Sanjuana Kava, MD 8/20/202010:42 AM

## 2019-08-01 DIAGNOSIS — R7989 Other specified abnormal findings of blood chemistry: Secondary | ICD-10-CM | POA: Diagnosis not present

## 2019-08-09 ENCOUNTER — Encounter: Payer: Self-pay | Admitting: Orthopedic Surgery

## 2019-08-09 ENCOUNTER — Ambulatory Visit (INDEPENDENT_AMBULATORY_CARE_PROVIDER_SITE_OTHER): Payer: Medicare Other | Admitting: Orthopedic Surgery

## 2019-08-09 ENCOUNTER — Other Ambulatory Visit: Payer: Self-pay

## 2019-08-09 ENCOUNTER — Ambulatory Visit: Payer: Medicare Other

## 2019-08-09 VITALS — BP 141/79 | HR 76 | Ht 70.5 in | Wt 319.0 lb

## 2019-08-09 DIAGNOSIS — G8929 Other chronic pain: Secondary | ICD-10-CM

## 2019-08-09 DIAGNOSIS — Z6841 Body Mass Index (BMI) 40.0 and over, adult: Secondary | ICD-10-CM

## 2019-08-09 DIAGNOSIS — M25562 Pain in left knee: Secondary | ICD-10-CM

## 2019-08-09 NOTE — Patient Instructions (Addendum)
Lose 30 lbs    Preparing for Knee Replacement Getting prepared before knee replacement surgery can make your recovery easier and more comfortable. This document provides some tips and guidelines that will help you prepare for your surgery. Talk with your health care provider so you can learn what to expect before, during, and after surgery. Ask questions if you do not understand something. To ease concerns about your financial responsibilities, call your insurance company as soon as you decide to have surgery. Ask how much of your surgery and hospital stay will be covered. Also ask about coverage for medical equipment, rehabilitation facilities, and home care. How should I arrange for help? In the first couple weeks after surgery, it will likely be harder for you to do some of your regular activities. You may get tired easily, and you will have limited movement in your leg. Follow these guidelines to make sure you have all the help you need after your surgery:  Plan to have someone take you home from the hospital. Your health care provider will tell you how many days you can expect to be in the hospital.  Cancel all your work, caregiving, and volunteer responsibilities for at least 4-6 weeks after your surgery.  Plan to have someone stay with you day and night for the first week. This person should be someone you are comfortable with. You may need this person to help you with your exercises and personal care, such as bathing and using the toilet.  If you live alone, arrange for someone to take care of your home and pets for the first 4-6 weeks after surgery.  Arrange for drivers to take you to and from follow-up appointments, the grocery store, and other places you may need to go for at least 4-6 weeks.  Consider applying for a disabled parking permit. To get an application, contact the Department of Motor Vehicles or your health care provider's office. How should I prepare my home?       Pick a recovery spot, but do not plan on recovering in bed. Sitting upright is better for your health. You may want to use a recliner with a small table nearby. Place the items you use most frequently on that table. These may include the TV remote, a cordless phone, a cell phone, a book or laptop computer, and a water glass.  To see if you will be able to move around in your home with a walker, hold your hands out about 6 inches (15 cm) from your sides, and walk from your recovery spot to your kitchen and bathroom. Then walk from your bed to the bathroom. If you do not hit anything with your hands, you will have enough room for a walker.  Minimize the use of stairs after you return home to reduce your risk of falling or tripping.  Remove all clutter from your floors. Also remove any throw rugs. This will help you avoid tripping after your surgery.  Move the items you use most often in your kitchen, bathroom, and bedroom to shelves and drawers that are at countertop height.  Prepare a few meals to freeze and reheat later.  Consider getting safety equipment that will be helpful during your recovery, such as: ? Grab bars added in the shower and near the toilet. ? A raised toilet seat to help you get on and off the toilet more easily. ? A tub or shower bench. How should I prepare my body?  Have a preoperative exam. ?  During the exam, your health care provider will make sure that your body is healthy enough to safely have the surgery. ? When you go to the exam, bring a complete list of all your medicines and supplements, including herbs and vitamins. ? You may need to have additional tests to ensure your safety.  Have elective dental care and routine cleanings done before your surgery. Germs from anywhere in your body, including your mouth, can travel to your new joint and infect it. It is important that you do not have any dental work done for at least 3 months after your  surgery.  Maintain a healthy diet. Do not change your diet before surgery unless your health care provider tells you to do that.  Do not use any products that contain nicotine or tobacco, such as cigarettes and e-cigarettes. These can delay bone healing after surgery. If you need help quitting, ask your health care provider.  Tell your health care provider if: ? You develop any skin infections or skin irritations. You may need to improve the condition of your skin before surgery. ? You have a fever, a cold, or any other illness in the week before your surgery.  Do not drink any alcohol for at least 48 hours before surgery.  The day before your surgery, follow instructions from your health care provider about showering, eating, drinking, and taking medicines. These directions are for your safety.  Talk to your health care provider about doing exercises before your surgery. ? Be sure to follow the exercise program only as directed by your health care provider. ? Doing these exercises in the weeks before your surgery may help reduce pain and improve function after surgery. Summary  Getting prepared before knee replacement surgery can make your recovery easier and more comfortable.  Prepare your home and arrange for help at home.  Keep all of your preoperative appointments to ensure that you are ready for your surgery.  Plan to have someone take you home from the hospital and stay with you day and night for the first week. This information is not intended to replace advice given to you by your health care provider. Make sure you discuss any questions you have with your health care provider. Document Released: 02/27/2011 Document Revised: 01/10/2018 Document Reviewed: 01/10/2018 Elsevier Patient Education  2020 [place].  Total Knee Replacement  Total knee replacement is a surgery to replace a bad knee joint with a man-made (prosthetic) joint. The man-made joint is called a  prosthesis. This joint may be made of plastic, metal, or ceramic parts. It replaces parts of the thigh bone (femur), lower leg bone (tibia), and kneecap (patella). This is done to reduce pain and help you move better. Tell your doctor about:  Any allergies you have.  All medicines you are taking. This includes vitamins, herbs, eye drops, creams, and over-the-counter medicines.  Any problems you or family members have had with anesthetic medicines.  Any blood disorders you have.  Any surgeries you have had.  Any medical conditions you have.  Whether you are pregnant or may be pregnant. What are the risks? Generally, this is a safe procedure. However, problems may occur, such as:  Infection.  Bleeding.  Blood clot.  Allergic reaction to medicines.  Damage to nerves and other parts of the knee.  Not being able to move your knee as much.  A feeling that the knee is weak or unstable.  Loosening of the new joint.  Pain that does  not go away (chronic pain). What happens before the procedure? Staying hydrated Follow instructions from your doctor about hydration. These may include:  Up to 2 hours before the procedure - you may continue to drink clear liquids. These include water, clear fruit juice, black coffee, and plain tea.  Eating and drinking Follow instructions from your doctor about eating and drinking. These may include:  8 hours before the procedure - stop eating heavy meals or foods. These include meat, fried foods, or fatty foods.  6 hours before the procedure - stop eating light meals or foods. These include toast or cereal.  6 hours before the procedure - stop drinking milk or drinks that contain milk.  2 hours before the procedure - stop drinking clear liquids. Medicines Ask your doctor about:  Changing or stopping your normal medicines. This is important.  Taking aspirin and ibuprofen. Do not take these medicines unless your doctor tells you to take  them.  Taking over-the-counter medicines, vitamins, herbs, and supplements. Tests and exams  You may have a physical exam.  You may have tests, such as: ? X-rays. ? MRI. ? CT scan. ? Bone scans.  You may have a blood or urine sample taken. Lifestyle   If your doctor tells you to do physical therapy, do exercises as told.  Keep your body and teeth clean. Germs from anywhere in your body can infect your new joint. Tell your doctor if: ? You plan to have dental care and routine cleanings. ? You get any skin infections.  If you are overweight, work with your doctor to reach a safe weight. Extra weight can affect your knee.  Do not use any products that contain nicotine or tobacco, such as cigarettes, e-cigarettes, and chewing tobacco. These can delay healing. If you need help quitting, ask your doctor. General instructions  Plan to have someone take you home from the hospital or clinic.  If you will be going home right after the procedure, plan to have someone with you for at least 24 hours. It is best to have someone to help care for you for at least 4-6 weeks after surgery.  Do not shave your legs just before surgery. This will be done in the hospital if needed.  Ask your doctor: ? How your surgery site will be marked. ? What steps will be taken to help prevent the spread of germs. These may include:  Removing hair at the surgery site.  Washing skin with a germ-killing soap. What happens during the procedure?  An IV tube will be put into one of your veins.  You will be given one or more of the following: ? A medicine to help you relax (sedative). ? A medicine to numb everything below the injection site (peripheral nerve block). ? A medicine that numbs your body below the waist (spinal anesthetic). ? A medicine to make you fall asleep (general anesthetic).  A cut (incision) will be made in your knee.  Damaged parts of your thigh bone, lower leg bone, and kneecap will  be removed.  Parts of the new joint will be placed on your thigh bone, your lower leg bone, and the underside of your kneecap.  One or more small tubes (drains) may be placed near your cut. This will help to drain fluid.  Your cut will be closed. Your doctor will use stitches (sutures), skin glue, or skin tape (adhesive) strips.  A bandage (dressing) will be placed over your cut. The procedure may vary  among doctors and hospitals. What happens after the procedure?  You will be monitored until you leave the hospital or clinic. Your blood pressure, heart rate, breathing rate, and blood oxygen level will be checked.  You will be given medicines for pain.  You may: ? Keep getting fluids through an IV tube. ? Have fluid coming from the drain. ? Have to wear special socks (compression stockings). ? Be given a knee joint motion machine (continuous passive motion machine) to use at home.  You will be told to move around as much as you can.  Do not drive until your health care provider tells you it is okay. Summary  Total knee replacement is a surgery to replace a bad knee joint with a man-made joint.  Before the procedure, follow what you are told about eating, drinking, and taking medicines.  Plan to have someone take you home from the hospital or clinic. This information is not intended to replace advice given to you by your health care provider. Make sure you discuss any questions you have with your health care provider. Document Released: 02/15/2012 Document Revised: 07/07/2018 Document Reviewed: 07/07/2018 Elsevier Patient Education  2020 [employer].

## 2019-08-09 NOTE — Progress Notes (Addendum)
Jon Whitney  08/09/2019  HISTORY SECTION :  Chief Complaint  Patient presents with  . Knee Pain    left knee / wants to discuss total knee replacement   HPI The patient presents for evaluation of possible left tka.  He is 320 pounds BMI is 45  Location left knee pain medial joint line Duration many years Quality dull ache Severity severe Associated with stiffness trouble walking  Review of Systems  Respiratory: Positive for shortness of breath.   Cardiovascular: Negative for chest pain.  Musculoskeletal: Positive for back pain and joint pain.     Past Medical History:  Diagnosis Date  . Anxiety   . Arthritis   . Asthma   . 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 GERD (gastroesophageal reflux disease)   . H/O hiatal hernia   . Heart murmur    as a child "it went away"  . Hypertension   . Psychiatric disorder   . PTSD (post-traumatic stress disorder)   . Sleep apnea    uses cpap    Past Surgical History:  Procedure Laterality Date  . COLONOSCOPY    . FOOT SURGERY Right   . HAND SURGERY Right    finger amputations  . MOUTH SURGERY     prosthetic soft palate     Allergies  Allergen Reactions  . Penicillins Hives and Rash     Current Outpatient Medications:  .  acetaminophen (TYLENOL) 325 MG tablet, , Disp: , Rfl:  .  albuterol (PROVENTIL HFA;VENTOLIN HFA) 108 (90 BASE) MCG/ACT inhaler, Inhale 1 puff into the lungs every 6 (six) hours as needed for wheezing or shortness of breath., Disp: , Rfl:  .  ALPRAZolam (XANAX) 0.25 MG tablet, , Disp: , Rfl:  .  ALPRAZolam (XANAX) 0.5 MG tablet, Take 0.5 mg by mouth 3 (three) times daily., Disp: , Rfl:  .  amLODipine (NORVASC) 10 MG tablet, Take 10 mg by mouth daily., Disp: , Rfl:  .  carvedilol (COREG)  3.125 MG tablet, , Disp: , Rfl:  .  cetirizine (ZYRTEC) 10 MG tablet, Take 10 mg by mouth daily., Disp: , Rfl:  .  chlorthalidone (HYGROTON) 25 MG tablet, , Disp: , Rfl:  .  diclofenac (VOLTAREN) 75 MG EC tablet, Take 1 tablet (75 mg total) by mouth 2 (two) times daily with a meal., Disp: 60 tablet, Rfl: 2 .  docusate sodium (COLACE) 100 MG capsule, Take 200 mg by mouth daily., Disp: , Rfl:  .  fluticasone (FLONASE) 50 MCG/ACT nasal spray, Place 2 sprays into the nose daily., Disp: , Rfl:  .  hydrOXYzine (ATARAX/VISTARIL) 25 MG tablet, Take 50 mg by mouth 3 (three) times daily., Disp: , Rfl:  .  loratadine (CLARITIN) 10 MG tablet, , Disp: , Rfl:  .  Menthol-Methyl Salicylate (THERA-GESIC) 1-15 % CREA, , Disp: , Rfl:  .  methocarbamol (ROBAXIN) 500 MG tablet, Take 500 mg by mouth 3 (three) times daily as needed for muscle spasms., Disp: , Rfl:  .  Multiple Vitamin (MULTIVITAMIN WITH MINERALS) TABS tablet, Take 1 tablet by mouth daily., Disp: , Rfl:  .  omeprazole (PRILOSEC) 20 MG capsule, Take 20 mg by mouth 3 (three) times daily before meals., Disp: , Rfl:  .  ondansetron (ZOFRAN ODT) 8 MG disintegrating tablet,  Take 1 tablet (8 mg total) by mouth every 8 (eight) hours as needed for nausea or vomiting., Disp: 10 tablet, Rfl: 0 .  pravastatin (PRAVACHOL) 40 MG tablet, Take 20 mg by mouth at bedtime., Disp: , Rfl:  .  sildenafil (VIAGRA) 100 MG tablet, , Disp: , Rfl:  .  traMADol (ULTRAM) 50 MG tablet, Take 1 tablet (50 mg total) by mouth every 6 (six) hours as needed., Disp: 60 tablet, Rfl: 3   PHYSICAL EXAM SECTION: 1) BP (!) 141/79   Pulse 76   Ht 5' 10.5" (1.791 m)   Wt (!) 319 lb (144.7 kg)   BMI 45.12 kg/m   Body mass index is 45.12 kg/m. General appearance: Well-developed well-nourished no gross deformities  2) Cardiovascular normal pulse and perfusion in the lower  extremities normal color without edema  3) Neurologically deep tendon reflexes are equal and normal, no sensation  loss or deficits no pathologic reflexes  4) Psychological: Awake alert and oriented x3 mood and affect normal  5) Skin no lacerations or ulcerations no nodularity no palpable masses, no erythema or nodularity  6) Musculoskeletal:   Left knee Walks with a cane has a mild varus thrust has a varus knee medial compartment tenderness no effusion strength is normal knee is stable flexion 100 measured by goniometer Right knee 105 degrees of flexion stable  MEDICAL DECISION SECTION:  Encounter Diagnoses  Name Primary?  . Chronic pain of left knee Yes  . Morbid obesity ([place])   . Body mass index 40.0-44.9, adult [facility])    The patient meets the AMA guidelines for Morbid (severe) obesity with a BMI > 40.0 and I have recommended weight loss.  Imaging Knee x-rays left knee severe arthritis medial compartment moderate varus deformity  Plan:  (Rx., Inj., surg., Frx, MRI/CT, XR:2)  Recommend 30 pound weight loss and then proceed with knee replacement  10:21 AM Arther Abbott, MD  08/09/2019

## 2019-08-31 DIAGNOSIS — R7989 Other specified abnormal findings of blood chemistry: Secondary | ICD-10-CM | POA: Diagnosis not present

## 2019-09-14 DIAGNOSIS — R7989 Other specified abnormal findings of blood chemistry: Secondary | ICD-10-CM | POA: Diagnosis not present

## 2019-09-14 DIAGNOSIS — Z23 Encounter for immunization: Secondary | ICD-10-CM | POA: Diagnosis not present

## 2019-10-10 ENCOUNTER — Ambulatory Visit (INDEPENDENT_AMBULATORY_CARE_PROVIDER_SITE_OTHER): Payer: Medicare Other | Admitting: Orthopaedic Surgery

## 2019-10-10 ENCOUNTER — Encounter: Payer: Self-pay | Admitting: Orthopaedic Surgery

## 2019-10-10 ENCOUNTER — Other Ambulatory Visit: Payer: Self-pay

## 2019-10-10 DIAGNOSIS — G8929 Other chronic pain: Secondary | ICD-10-CM | POA: Diagnosis not present

## 2019-10-10 DIAGNOSIS — M25562 Pain in left knee: Secondary | ICD-10-CM

## 2019-10-10 DIAGNOSIS — Z6841 Body Mass Index (BMI) 40.0 and over, adult: Secondary | ICD-10-CM

## 2019-10-10 NOTE — Progress Notes (Signed)
PROCEDURE NOTE:  The patient requests injections of the left knee , verbal consent was obtained.  The left knee was prepped appropriately after time out was performed.   Sterile technique was observed and injection of 1 cc of Depo-Medrol 40 mg with several cc's of plain xylocaine. Anesthesia was provided by ethyl chloride and a 20-gauge needle was used to inject the knee area. The injection was tolerated well.  A band aid dressing was applied.  The patient was advised to apply ice later today and tomorrow to the injection sight as needed.  Return in six weeks.  Electronically Signed Sanjuana Kava, MD 11/3/202010:14 AM

## 2019-10-13 DIAGNOSIS — R7989 Other specified abnormal findings of blood chemistry: Secondary | ICD-10-CM | POA: Diagnosis not present

## 2019-11-21 ENCOUNTER — Ambulatory Visit: Payer: Medicare Other | Admitting: Orthopaedic Surgery

## 2019-11-28 ENCOUNTER — Ambulatory Visit (INDEPENDENT_AMBULATORY_CARE_PROVIDER_SITE_OTHER): Payer: Medicare Other | Admitting: Orthopaedic Surgery

## 2019-11-28 ENCOUNTER — Encounter: Payer: Self-pay | Admitting: Orthopaedic Surgery

## 2019-11-28 ENCOUNTER — Other Ambulatory Visit: Payer: Self-pay

## 2019-11-28 VITALS — BP 219/109 | HR 84 | Temp 96.8°F | Ht 71.0 in | Wt 313.0 lb

## 2019-11-28 DIAGNOSIS — M25562 Pain in left knee: Secondary | ICD-10-CM | POA: Diagnosis not present

## 2019-11-28 DIAGNOSIS — G8929 Other chronic pain: Secondary | ICD-10-CM | POA: Diagnosis not present

## 2019-11-28 DIAGNOSIS — Z6841 Body Mass Index (BMI) 40.0 and over, adult: Secondary | ICD-10-CM

## 2019-11-28 NOTE — Progress Notes (Signed)
PROCEDURE NOTE:  The patient requests injections of the left knee , verbal consent was obtained.  The left knee was prepped appropriately after time out was performed.   Sterile technique was observed and injection of 1 cc of Depo-Medrol 40 mg with several cc's of plain xylocaine. Anesthesia was provided by ethyl chloride and a 20-gauge needle was used to inject the knee area. The injection was tolerated well.  A band aid dressing was applied.  The patient was advised to apply ice later today and tomorrow to the injection sight as needed.  See in six weeks.  Electronically Signed Sanjuana Kava, MD 12/22/202010:13 AM

## 2019-12-13 DIAGNOSIS — R7989 Other specified abnormal findings of blood chemistry: Secondary | ICD-10-CM | POA: Diagnosis not present

## 2020-01-09 ENCOUNTER — Encounter: Payer: Self-pay | Admitting: Orthopaedic Surgery

## 2020-01-09 ENCOUNTER — Other Ambulatory Visit: Payer: Self-pay

## 2020-01-09 ENCOUNTER — Ambulatory Visit (INDEPENDENT_AMBULATORY_CARE_PROVIDER_SITE_OTHER): Payer: Medicare Other | Admitting: Orthopaedic Surgery

## 2020-01-09 VITALS — Ht 71.0 in | Wt 313.0 lb

## 2020-01-09 DIAGNOSIS — M25562 Pain in left knee: Secondary | ICD-10-CM

## 2020-01-09 DIAGNOSIS — Z6841 Body Mass Index (BMI) 40.0 and over, adult: Secondary | ICD-10-CM

## 2020-01-09 DIAGNOSIS — G8929 Other chronic pain: Secondary | ICD-10-CM

## 2020-01-09 NOTE — Progress Notes (Signed)
CC:  I have pain of my left knee. I would like an injection.  The patient has chronic pain of the left knee.  There is no recent trauma.  There is no redness.  Injections in the past have helped.  The knee has no redness, has an effusion and crepitus present.  ROM of the left knee is 0-105.  Impression:  Chronic knee pain left  Return: 6 weeks  PROCEDURE NOTE:  The patient requests injections of the left knee, verbal consent was obtained.  The left knee was prepped appropriately after time out was performed.   Sterile technique was observed and injection of 1 cc of Depo-Medrol 40 mg with several cc's of plain xylocaine. Anesthesia was provided by ethyl chloride and a 20-gauge needle was used to inject the knee area. The injection was tolerated well.  A band aid dressing was applied.  The patient was advised to apply ice later today and tomorrow to the injection sight as needed.  Electronically Signed Sanjuana Kava, MD 2/2/20219:03 AM

## 2020-01-12 DIAGNOSIS — R7989 Other specified abnormal findings of blood chemistry: Secondary | ICD-10-CM | POA: Diagnosis not present

## 2020-02-20 ENCOUNTER — Ambulatory Visit (INDEPENDENT_AMBULATORY_CARE_PROVIDER_SITE_OTHER): Payer: Medicare Other | Admitting: Orthopaedic Surgery

## 2020-02-20 ENCOUNTER — Other Ambulatory Visit: Payer: Self-pay

## 2020-02-20 ENCOUNTER — Encounter: Payer: Self-pay | Admitting: Orthopaedic Surgery

## 2020-02-20 DIAGNOSIS — M25562 Pain in left knee: Secondary | ICD-10-CM | POA: Diagnosis not present

## 2020-02-20 DIAGNOSIS — G8929 Other chronic pain: Secondary | ICD-10-CM | POA: Diagnosis not present

## 2020-02-20 DIAGNOSIS — Z6841 Body Mass Index (BMI) 40.0 and over, adult: Secondary | ICD-10-CM

## 2020-02-20 NOTE — Progress Notes (Signed)
PROCEDURE NOTE:  The patient requests injections of the left knee , verbal consent was obtained.  The left knee was prepped appropriately after time out was performed.   Sterile technique was observed and injection of 1 cc of Depo-Medrol 40 mg with several cc's of plain xylocaine. Anesthesia was provided by ethyl chloride and a 20-gauge needle was used to inject the knee area. The injection was tolerated well.  A band aid dressing was applied.  The patient was advised to apply ice later today and tomorrow to the injection sight as needed.  Return in six weeks.  Electronically Signed Sanjuana Kava, MD 3/16/20211:35 PM

## 2020-03-01 DIAGNOSIS — R7989 Other specified abnormal findings of blood chemistry: Secondary | ICD-10-CM | POA: Diagnosis not present

## 2020-03-21 DIAGNOSIS — L648 Other androgenic alopecia: Secondary | ICD-10-CM | POA: Diagnosis not present

## 2020-03-27 DIAGNOSIS — R7989 Other specified abnormal findings of blood chemistry: Secondary | ICD-10-CM | POA: Diagnosis not present

## 2020-04-02 ENCOUNTER — Encounter: Payer: Self-pay | Admitting: Orthopaedic Surgery

## 2020-04-02 ENCOUNTER — Ambulatory Visit (INDEPENDENT_AMBULATORY_CARE_PROVIDER_SITE_OTHER): Payer: Medicare Other | Admitting: Orthopaedic Surgery

## 2020-04-02 ENCOUNTER — Other Ambulatory Visit: Payer: Self-pay

## 2020-04-02 VITALS — Ht 71.0 in | Wt 314.0 lb

## 2020-04-02 DIAGNOSIS — Z6841 Body Mass Index (BMI) 40.0 and over, adult: Secondary | ICD-10-CM

## 2020-04-02 DIAGNOSIS — G8929 Other chronic pain: Secondary | ICD-10-CM

## 2020-04-02 DIAGNOSIS — M25562 Pain in left knee: Secondary | ICD-10-CM

## 2020-04-02 NOTE — Progress Notes (Signed)
PROCEDURE NOTE:  The patient requests injections of the left knee , verbal consent was obtained.  The left knee was prepped appropriately after time out was performed.   Sterile technique was observed and injection of 1 cc of Depo-Medrol 40 mg with several cc's of plain xylocaine. Anesthesia was provided by ethyl chloride and a 20-gauge needle was used to inject the knee area. The injection was tolerated well.  A band aid dressing was applied.  The patient was advised to apply ice later today and tomorrow to the injection sight as needed.  Return in six weeks.  Electronically Signed Sanjuana Kava, MD 4/27/20211:52 PM

## 2020-04-26 DIAGNOSIS — R7989 Other specified abnormal findings of blood chemistry: Secondary | ICD-10-CM | POA: Diagnosis not present

## 2020-05-14 ENCOUNTER — Telehealth: Payer: Self-pay | Admitting: Orthopaedic Surgery

## 2020-05-14 ENCOUNTER — Ambulatory Visit: Payer: Medicare Other | Admitting: Orthopaedic Surgery

## 2020-05-14 MED ORDER — TRAMADOL HCL 50 MG PO TABS: ORAL_TABLET | ORAL | 1 refills | Status: DC

## 2020-05-14 NOTE — Telephone Encounter (Signed)
Patient requests refill on Tramadol 50 mgs.  Qty  60  Sig: Take 1 tablet (50 mg total) by mouth every 6 (six) hours as needed.  Patient uses [facility] on [address]

## 2020-05-24 DIAGNOSIS — R7989 Other specified abnormal findings of blood chemistry: Secondary | ICD-10-CM | POA: Diagnosis not present

## 2020-05-28 DIAGNOSIS — E039 Hypothyroidism, unspecified: Secondary | ICD-10-CM | POA: Diagnosis not present

## 2020-05-28 DIAGNOSIS — I10 Essential (primary) hypertension: Secondary | ICD-10-CM | POA: Diagnosis not present

## 2020-05-28 DIAGNOSIS — Z0001 Encounter for general adult medical examination with abnormal findings: Secondary | ICD-10-CM | POA: Diagnosis not present

## 2020-05-28 DIAGNOSIS — Z1389 Encounter for screening for other disorder: Secondary | ICD-10-CM | POA: Diagnosis not present

## 2020-05-28 DIAGNOSIS — E7849 Other hyperlipidemia: Secondary | ICD-10-CM | POA: Diagnosis not present

## 2020-05-28 DIAGNOSIS — G4733 Obstructive sleep apnea (adult) (pediatric): Secondary | ICD-10-CM | POA: Diagnosis not present

## 2020-06-17 DIAGNOSIS — R7989 Other specified abnormal findings of blood chemistry: Secondary | ICD-10-CM | POA: Diagnosis not present

## 2020-07-16 ENCOUNTER — Other Ambulatory Visit: Payer: Self-pay

## 2020-07-16 ENCOUNTER — Encounter: Payer: Self-pay | Admitting: Orthopaedic Surgery

## 2020-07-16 ENCOUNTER — Ambulatory Visit (INDEPENDENT_AMBULATORY_CARE_PROVIDER_SITE_OTHER): Payer: Medicare Other | Admitting: Orthopaedic Surgery

## 2020-07-16 DIAGNOSIS — M25562 Pain in left knee: Secondary | ICD-10-CM

## 2020-07-16 DIAGNOSIS — G8929 Other chronic pain: Secondary | ICD-10-CM | POA: Diagnosis not present

## 2020-07-16 DIAGNOSIS — Z6841 Body Mass Index (BMI) 40.0 and over, adult: Secondary | ICD-10-CM

## 2020-07-16 NOTE — Progress Notes (Signed)
PROCEDURE NOTE:  The patient requests injections of the left knee , verbal consent was obtained.  The left knee was prepped appropriately after time out was performed.   Sterile technique was observed and injection of 1 cc of Depo-Medrol 40 mg with several cc's of plain xylocaine. Anesthesia was provided by ethyl chloride and a 20-gauge needle was used to inject the knee area. The injection was tolerated well.  A band aid dressing was applied.  The patient was advised to apply ice later today and tomorrow to the injection sight as needed.  Return in six weeks.  Call if any problem.  Precautions discussed.   Electronically Signed Sanjuana Kava, MD 8/10/20212:15 PM

## 2020-08-27 ENCOUNTER — Ambulatory Visit (INDEPENDENT_AMBULATORY_CARE_PROVIDER_SITE_OTHER): Payer: Medicare Other | Admitting: Orthopaedic Surgery

## 2020-08-27 ENCOUNTER — Encounter: Payer: Self-pay | Admitting: Orthopaedic Surgery

## 2020-08-27 ENCOUNTER — Other Ambulatory Visit: Payer: Self-pay

## 2020-08-27 DIAGNOSIS — M25561 Pain in right knee: Secondary | ICD-10-CM

## 2020-08-27 DIAGNOSIS — R7989 Other specified abnormal findings of blood chemistry: Secondary | ICD-10-CM | POA: Diagnosis not present

## 2020-08-27 DIAGNOSIS — M25562 Pain in left knee: Secondary | ICD-10-CM | POA: Diagnosis not present

## 2020-08-27 DIAGNOSIS — G8929 Other chronic pain: Secondary | ICD-10-CM | POA: Diagnosis not present

## 2020-08-27 NOTE — Progress Notes (Signed)
PROCEDURE NOTE:  The patient requests injections of the left knee , verbal consent was obtained.  The left knee was prepped appropriately after time out was performed.   Sterile technique was observed and injection of 1 cc of Depo-Medrol 40 mg with several cc's of plain xylocaine. Anesthesia was provided by ethyl chloride and a 20-gauge needle was used to inject the knee area. The injection was tolerated well.  A band aid dressing was applied.  The patient was advised to apply ice later today and tomorrow to the injection sight as needed.  PROCEDURE NOTE:  The patient requests injections of the right knee , verbal consent was obtained.  The right knee was prepped appropriately after time out was performed.   Sterile technique was observed and injection of 1 cc of Depo-Medrol 40 mg with several cc's of plain xylocaine. Anesthesia was provided by ethyl chloride and a 20-gauge needle was used to inject the knee area. The injection was tolerated well.  A band aid dressing was applied.  The patient was advised to apply ice later today and tomorrow to the injection sight as needed.  Return in six weeks.  Electronically Signed Sanjuana Kava, MD 9/21/20212:02 PM

## 2020-09-10 DIAGNOSIS — E7489 Other specified disorders of carbohydrate metabolism: Secondary | ICD-10-CM | POA: Diagnosis not present

## 2020-09-24 DIAGNOSIS — M5459 Other low back pain: Secondary | ICD-10-CM | POA: Diagnosis not present

## 2020-10-08 ENCOUNTER — Ambulatory Visit: Payer: Medicare Other | Admitting: Orthopaedic Surgery

## 2020-11-18 DIAGNOSIS — R7989 Other specified abnormal findings of blood chemistry: Secondary | ICD-10-CM | POA: Diagnosis not present

## 2021-02-04 ENCOUNTER — Other Ambulatory Visit: Payer: Self-pay

## 2021-02-04 ENCOUNTER — Encounter: Payer: Self-pay | Admitting: Orthopaedic Surgery

## 2021-02-04 ENCOUNTER — Ambulatory Visit (INDEPENDENT_AMBULATORY_CARE_PROVIDER_SITE_OTHER): Payer: Medicare Other | Admitting: Orthopaedic Surgery

## 2021-02-04 DIAGNOSIS — Z6841 Body Mass Index (BMI) 40.0 and over, adult: Secondary | ICD-10-CM

## 2021-02-04 DIAGNOSIS — G8929 Other chronic pain: Secondary | ICD-10-CM

## 2021-02-04 DIAGNOSIS — M25562 Pain in left knee: Secondary | ICD-10-CM | POA: Diagnosis not present

## 2021-02-04 DIAGNOSIS — M25561 Pain in right knee: Secondary | ICD-10-CM

## 2021-02-04 NOTE — Progress Notes (Signed)
PROCEDURE NOTE:  The patient requests injections of the left knee , verbal consent was obtained.  The left knee was prepped appropriately after time out was performed.   Sterile technique was observed and injection of 1 cc of Celestone 6 mg with several cc's of plain xylocaine. Anesthesia was provided by ethyl chloride and a 20-gauge needle was used to inject the knee area. The injection was tolerated well.  A band aid dressing was applied.  The patient was advised to apply ice later today and tomorrow to the injection sight as needed.  PROCEDURE NOTE:  The patient requests injections of the right knee , verbal consent was obtained.  The right knee was prepped appropriately after time out was performed.   Sterile technique was observed and injection of 1 cc of Celestone 6 mg with several cc's of plain xylocaine. Anesthesia was provided by ethyl chloride and a 20-gauge needle was used to inject the knee area. The injection was tolerated well.  A band aid dressing was applied.  The patient was advised to apply ice later today and tomorrow to the injection sight as needed.  Return in six weeks.  Call if any problem.  Precautions discussed.   Electronically Signed Sanjuana Kava, MD 3/1/20221:48 PM

## 2021-02-26 DIAGNOSIS — R7989 Other specified abnormal findings of blood chemistry: Secondary | ICD-10-CM | POA: Diagnosis not present

## 2021-03-18 ENCOUNTER — Encounter: Payer: Self-pay | Admitting: Orthopaedic Surgery

## 2021-03-18 ENCOUNTER — Ambulatory Visit (INDEPENDENT_AMBULATORY_CARE_PROVIDER_SITE_OTHER): Payer: Medicare Other | Admitting: Orthopaedic Surgery

## 2021-03-18 ENCOUNTER — Other Ambulatory Visit: Payer: Self-pay

## 2021-03-18 VITALS — BP 182/73 | HR 72 | Temp 99.1°F | Resp 16 | Ht 71.0 in

## 2021-03-18 DIAGNOSIS — M25561 Pain in right knee: Secondary | ICD-10-CM | POA: Diagnosis not present

## 2021-03-18 DIAGNOSIS — G8929 Other chronic pain: Secondary | ICD-10-CM | POA: Diagnosis not present

## 2021-03-18 DIAGNOSIS — M25562 Pain in left knee: Secondary | ICD-10-CM

## 2021-03-18 DIAGNOSIS — Z6841 Body Mass Index (BMI) 40.0 and over, adult: Secondary | ICD-10-CM

## 2021-03-18 NOTE — Progress Notes (Signed)
PROCEDURE NOTE:  The patient requests injections of the left knee , verbal consent was obtained.  The left knee was prepped appropriately after time out was performed.   Sterile technique was observed and injection of 1 cc of Celestone 6 mg with several cc's of plain xylocaine. Anesthesia was provided by ethyl chloride and a 20-gauge needle was used to inject the knee area. The injection was tolerated well.  A band aid dressing was applied.  The patient was advised to apply ice later today and tomorrow to the injection sight as needed.  PROCEDURE NOTE:  The patient requests injections of the right knee , verbal consent was obtained.  The right knee was prepped appropriately after time out was performed.   Sterile technique was observed and injection of 1 cc of Celestone 6 mg with several cc's of plain xylocaine. Anesthesia was provided by ethyl chloride and a 20-gauge needle was used to inject the knee area. The injection was tolerated well.  A band aid dressing was applied.  The patient was advised to apply ice later today and tomorrow to the injection sight as needed.  Return in six weeks.  Call if any problem.  Precautions discussed.   Electronically Signed Sanjuana Kava, MD 4/12/20222:49 PM

## 2021-04-04 DIAGNOSIS — R7989 Other specified abnormal findings of blood chemistry: Secondary | ICD-10-CM | POA: Diagnosis not present

## 2021-04-29 ENCOUNTER — Ambulatory Visit: Payer: Medicare Other | Admitting: Orthopaedic Surgery

## 2021-06-05 ENCOUNTER — Other Ambulatory Visit: Payer: Self-pay

## 2021-06-05 ENCOUNTER — Ambulatory Visit (INDEPENDENT_AMBULATORY_CARE_PROVIDER_SITE_OTHER): Payer: Medicare Other | Admitting: Orthopaedic Surgery

## 2021-06-05 ENCOUNTER — Encounter: Payer: Self-pay | Admitting: Orthopaedic Surgery

## 2021-06-05 DIAGNOSIS — M25562 Pain in left knee: Secondary | ICD-10-CM | POA: Diagnosis not present

## 2021-06-05 DIAGNOSIS — M25561 Pain in right knee: Secondary | ICD-10-CM

## 2021-06-05 DIAGNOSIS — G8929 Other chronic pain: Secondary | ICD-10-CM

## 2021-06-05 DIAGNOSIS — L648 Other androgenic alopecia: Secondary | ICD-10-CM | POA: Diagnosis not present

## 2021-06-05 NOTE — Progress Notes (Signed)
CC: Both of my knees are hurting. I would like an injection in both knees.  The patient has had chronic pain and tenderness of both knees for some time.  Injections help.  There is no locking or giving way of the knee.  There is no new trauma. There is no redness or signs of infections.  The knees have a mild effusion and some crepitus.  There is no redness or signs of recent trauma.  Right knee ROM is 0-110 and left knee ROM is 0-105.  Impression:  Chronic pain of the both knees  Return:  6 weeks  PROCEDURE NOTE:  The patient requests injections of both knees, verbal consent was obtained.  The left and right knee were individually prepped appropriately after time out was performed.   Sterile technique was observed and injection of 1 cc of Celestone 6 mg with several cc's of plain xylocaine. Anesthesia was provided by ethyl chloride and a 20-gauge needle was used to inject each knee area. The injections were tolerated well.  A band aid dressing was applied.  The patient was advised to apply ice later today and tomorrow to the injection sight as needed.  Call if any problem.  Precautions discussed.  Electronically Signed Sanjuana Kava, MD 6/30/202210:41 AM

## 2021-07-22 ENCOUNTER — Ambulatory Visit: Payer: Medicare Other | Admitting: Orthopaedic Surgery

## 2021-07-24 ENCOUNTER — Encounter: Payer: Self-pay | Admitting: Orthopaedic Surgery

## 2021-07-24 ENCOUNTER — Ambulatory Visit (INDEPENDENT_AMBULATORY_CARE_PROVIDER_SITE_OTHER): Payer: Medicare Other | Admitting: Orthopaedic Surgery

## 2021-07-24 ENCOUNTER — Other Ambulatory Visit: Payer: Self-pay

## 2021-07-24 DIAGNOSIS — M25561 Pain in right knee: Secondary | ICD-10-CM

## 2021-07-24 DIAGNOSIS — Z6841 Body Mass Index (BMI) 40.0 and over, adult: Secondary | ICD-10-CM

## 2021-07-24 DIAGNOSIS — G8929 Other chronic pain: Secondary | ICD-10-CM

## 2021-07-24 DIAGNOSIS — M25562 Pain in left knee: Secondary | ICD-10-CM

## 2021-07-24 NOTE — Progress Notes (Signed)
PROCEDURE NOTE:  The patient requests injections of the right knee , verbal consent was obtained.  The right knee was prepped appropriately after time out was performed.   Sterile technique was observed and injection of 1 cc of Celestone '6mg'$  with several cc's of plain xylocaine. Anesthesia was provided by ethyl chloride and a 20-gauge needle was used to inject the knee area. The injection was tolerated well.  A band aid dressing was applied.  The patient was advised to apply ice later today and tomorrow to the injection sight as needed.   PROCEDURE NOTE:  The patient requests injections of the left knee , verbal consent was obtained.  The left knee was prepped appropriately after time out was performed.   Sterile technique was observed and injection of 1 cc of Celestone 6 mg with several cc's of plain xylocaine. Anesthesia was provided by ethyl chloride and a 20-gauge needle was used to inject the knee area. The injection was tolerated well.  A band aid dressing was applied.  The patient was advised to apply ice later today and tomorrow to the injection sight as needed.   Return in six weeks.  Call if any problem.  Precautions discussed.  Electronically Signed Sanjuana Kava, MD 8/18/202210:53 AM

## 2021-08-05 DIAGNOSIS — Z1389 Encounter for screening for other disorder: Secondary | ICD-10-CM | POA: Diagnosis not present

## 2021-08-05 DIAGNOSIS — R7309 Other abnormal glucose: Secondary | ICD-10-CM | POA: Diagnosis not present

## 2021-08-05 DIAGNOSIS — E782 Mixed hyperlipidemia: Secondary | ICD-10-CM | POA: Diagnosis not present

## 2021-08-05 DIAGNOSIS — G4730 Sleep apnea, unspecified: Secondary | ICD-10-CM | POA: Diagnosis not present

## 2021-08-05 DIAGNOSIS — Z0000 Encounter for general adult medical examination without abnormal findings: Secondary | ICD-10-CM | POA: Diagnosis not present

## 2021-08-05 DIAGNOSIS — I10 Essential (primary) hypertension: Secondary | ICD-10-CM | POA: Diagnosis not present

## 2021-08-05 DIAGNOSIS — E559 Vitamin D deficiency, unspecified: Secondary | ICD-10-CM | POA: Diagnosis not present

## 2021-09-04 ENCOUNTER — Ambulatory Visit (INDEPENDENT_AMBULATORY_CARE_PROVIDER_SITE_OTHER): Payer: Medicare Other | Admitting: Orthopaedic Surgery

## 2021-09-04 ENCOUNTER — Other Ambulatory Visit: Payer: Self-pay

## 2021-09-04 ENCOUNTER — Encounter: Payer: Self-pay | Admitting: Orthopaedic Surgery

## 2021-09-04 DIAGNOSIS — M25562 Pain in left knee: Secondary | ICD-10-CM

## 2021-09-04 DIAGNOSIS — G8929 Other chronic pain: Secondary | ICD-10-CM

## 2021-09-04 DIAGNOSIS — M25561 Pain in right knee: Secondary | ICD-10-CM | POA: Diagnosis not present

## 2021-09-04 DIAGNOSIS — Z6841 Body Mass Index (BMI) 40.0 and over, adult: Secondary | ICD-10-CM

## 2021-09-04 MED ORDER — TRAMADOL HCL 50 MG PO TABS: ORAL_TABLET | ORAL | 2 refills | Status: DC

## 2021-09-04 NOTE — Progress Notes (Signed)
PROCEDURE NOTE:  The patient requests injections of the right knee , verbal consent was obtained.  The right knee was prepped appropriately after time out was performed.   Sterile technique was observed and injection of 1 cc of Celestone 6mg  with several cc's of plain xylocaine. Anesthesia was provided by ethyl chloride and a 20-gauge needle was used to inject the knee area. The injection was tolerated well.  A band aid dressing was applied.  The patient was advised to apply ice later today and tomorrow to the injection sight as needed.   PROCEDURE NOTE:  The patient requests injections of the left knee , verbal consent was obtained.  The left knee was prepped appropriately after time out was performed.   Sterile technique was observed and injection of 1 cc of Celestone 6 mg with several cc's of plain xylocaine. Anesthesia was provided by ethyl chloride and a 20-gauge needle was used to inject the knee area. The injection was tolerated well.  A band aid dressing was applied.  The patient was advised to apply ice later today and tomorrow to the injection sight as needed.   Encounter Diagnoses  Name Primary?   Chronic pain of both knees Yes   Body mass index 40.0-44.9, adult (HCC)    Morbid obesity ([place])    Return in six weeks.  Call if any problem.  Precautions discussed.  Electronically Signed Sanjuana Kava, MD 9/29/202210:33 AM

## 2021-09-04 NOTE — Addendum Note (Signed)
Addended by: Willette Pa on: 09/04/2021 10:36 AM   Modules accepted: Orders

## 2021-10-16 ENCOUNTER — Other Ambulatory Visit: Payer: Self-pay

## 2021-10-16 ENCOUNTER — Ambulatory Visit (INDEPENDENT_AMBULATORY_CARE_PROVIDER_SITE_OTHER): Payer: Medicare Other | Admitting: Orthopaedic Surgery

## 2021-10-16 ENCOUNTER — Encounter: Payer: Self-pay | Admitting: Orthopaedic Surgery

## 2021-10-16 DIAGNOSIS — G8929 Other chronic pain: Secondary | ICD-10-CM | POA: Diagnosis not present

## 2021-10-16 DIAGNOSIS — M25561 Pain in right knee: Secondary | ICD-10-CM | POA: Diagnosis not present

## 2021-10-16 DIAGNOSIS — M25562 Pain in left knee: Secondary | ICD-10-CM

## 2021-10-16 DIAGNOSIS — Z6841 Body Mass Index (BMI) 40.0 and over, adult: Secondary | ICD-10-CM

## 2021-10-16 NOTE — Progress Notes (Signed)
PROCEDURE NOTE:  The patient requests injections of the left knee , verbal consent was obtained.  The left knee was prepped appropriately after time out was performed.   Sterile technique was observed and injection of 1 cc of DepoMedrol 40 mg with several cc's of plain xylocaine. Anesthesia was provided by ethyl chloride and a 20-gauge needle was used to inject the knee area. The injection was tolerated well.  A band aid dressing was applied.  The patient was advised to apply ice later today and tomorrow to the injection sight as needed.   PROCEDURE NOTE:  The patient requests injections of the right knee , verbal consent was obtained.  The right knee was prepped appropriately after time out was performed.   Sterile technique was observed and injection of 1 cc of DepoMedrol 40mg  with several cc's of plain xylocaine. Anesthesia was provided by ethyl chloride and a 20-gauge needle was used to inject the knee area. The injection was tolerated well.  A band aid dressing was applied.  The patient was advised to apply ice later today and tomorrow to the injection sight as needed.   Encounter Diagnoses  Name Primary?   Chronic pain of both knees Yes   Body mass index 40.0-44.9, adult (HCC)    Morbid obesity ([place])    Return in six weeks.  Call if any problem.  Precautions discussed.  Electronically Signed Sanjuana Kava, MD 11/10/202210:50 AM

## 2021-11-25 ENCOUNTER — Other Ambulatory Visit: Payer: Self-pay

## 2021-11-25 ENCOUNTER — Ambulatory Visit (INDEPENDENT_AMBULATORY_CARE_PROVIDER_SITE_OTHER): Payer: Medicare Other | Admitting: Orthopaedic Surgery

## 2021-11-25 ENCOUNTER — Encounter: Payer: Self-pay | Admitting: Orthopaedic Surgery

## 2021-11-25 VITALS — BP 202/99 | HR 73 | Ht 70.5 in | Wt 322.4 lb

## 2021-11-25 DIAGNOSIS — Z6841 Body Mass Index (BMI) 40.0 and over, adult: Secondary | ICD-10-CM

## 2021-11-25 DIAGNOSIS — M25561 Pain in right knee: Secondary | ICD-10-CM

## 2021-11-25 DIAGNOSIS — M25562 Pain in left knee: Secondary | ICD-10-CM | POA: Diagnosis not present

## 2021-11-25 DIAGNOSIS — G8929 Other chronic pain: Secondary | ICD-10-CM | POA: Diagnosis not present

## 2021-11-25 NOTE — Progress Notes (Signed)
PROCEDURE NOTE:  The patient requests injections of the left knee , verbal consent was obtained.  The left knee was prepped appropriately after time out was performed.   Sterile technique was observed and injection of 1 cc of DepoMedrol 40 mg with several cc's of plain xylocaine. Anesthesia was provided by ethyl chloride and a 20-gauge needle was used to inject the knee area. The injection was tolerated well.  A band aid dressing was applied.  The patient was advised to apply ice later today and tomorrow to the injection sight as needed.   PROCEDURE NOTE:  The patient requests injections of the right knee , verbal consent was obtained.  The right knee was prepped appropriately after time out was performed.   Sterile technique was observed and injection of 1 cc of DepoMedrol 40mg  with several cc's of plain xylocaine. Anesthesia was provided by ethyl chloride and a 20-gauge needle was used to inject the knee area. The injection was tolerated well.  A band aid dressing was applied.  The patient was advised to apply ice later today and tomorrow to the injection sight as needed.   Encounter Diagnoses  Name Primary?   Chronic pain of both knees Yes   Body mass index 40.0-44.9, adult (HCC)    Morbid obesity ([place])    Return in six weeks.  Call if any problem.  Precautions discussed.  Electronically Signed Sanjuana Kava, MD 12/20/20221:36 PM

## 2021-11-26 ENCOUNTER — Telehealth: Payer: Self-pay | Admitting: Orthopaedic Surgery

## 2021-11-26 NOTE — Telephone Encounter (Signed)
Patient left a voice message today, referencing that his blood pressure was up at the time of his office visit yesterday. Patient is asking if he may come back here to have his blood pressure checked again soon; said okay if there is a fee. States he has a blood pressure monitor at home but does not know how to use it. Please advise* *Per administrator Abigail Butts, okay to come in for another blood pressure check, however, he may also contact his primary care provider, who can also educate on how to use his home monitor; also his pharmacy may offer blood pressure check. States he may come tomorrow; he thanked [place] for the information

## 2022-01-06 ENCOUNTER — Other Ambulatory Visit: Payer: Self-pay

## 2022-01-06 ENCOUNTER — Encounter: Payer: Self-pay | Admitting: Orthopaedic Surgery

## 2022-01-06 ENCOUNTER — Ambulatory Visit (INDEPENDENT_AMBULATORY_CARE_PROVIDER_SITE_OTHER): Payer: Medicare Other | Admitting: Orthopaedic Surgery

## 2022-01-06 DIAGNOSIS — G8929 Other chronic pain: Secondary | ICD-10-CM | POA: Diagnosis not present

## 2022-01-06 DIAGNOSIS — M25562 Pain in left knee: Secondary | ICD-10-CM | POA: Diagnosis not present

## 2022-01-06 DIAGNOSIS — M25561 Pain in right knee: Secondary | ICD-10-CM | POA: Diagnosis not present

## 2022-01-06 DIAGNOSIS — Z6841 Body Mass Index (BMI) 40.0 and over, adult: Secondary | ICD-10-CM

## 2022-01-06 NOTE — Progress Notes (Signed)
PROCEDURE NOTE:  The patient requests injections of the left knee , verbal consent was obtained.  The left knee was prepped appropriately after time out was performed.   Sterile technique was observed and injection of 1 cc of DepoMedrol 40 mg with several cc's of plain xylocaine. Anesthesia was provided by ethyl chloride and a 20-gauge needle was used to inject the knee area. The injection was tolerated well.  A band aid dressing was applied.  The patient was advised to apply ice later today and tomorrow to the injection sight as needed.  PROCEDURE NOTE:  The patient requests injections of the right knee , verbal consent was obtained.  The right knee was prepped appropriately after time out was performed.   Sterile technique was observed and injection of 1 cc of DepoMedrol 40mg  with several cc's of plain xylocaine. Anesthesia was provided by ethyl chloride and a 20-gauge needle was used to inject the knee area. The injection was tolerated well.  A band aid dressing was applied.  The patient was advised to apply ice later today and tomorrow to the injection sight as needed.  Encounter Diagnoses  Name Primary?   Chronic pain of both knees Yes   Body mass index 40.0-44.9, adult (HCC)    Morbid obesity ([place])    Return in six weeks.  Call if any problem.  Precautions discussed.  Electronically Signed Sanjuana Kava, MD 1/31/20232:42 PM

## 2022-02-17 ENCOUNTER — Other Ambulatory Visit: Payer: Self-pay

## 2022-02-17 ENCOUNTER — Encounter: Payer: Self-pay | Admitting: Orthopaedic Surgery

## 2022-02-17 ENCOUNTER — Ambulatory Visit (INDEPENDENT_AMBULATORY_CARE_PROVIDER_SITE_OTHER): Payer: Medicare Other | Admitting: Orthopaedic Surgery

## 2022-02-17 DIAGNOSIS — M25562 Pain in left knee: Secondary | ICD-10-CM

## 2022-02-17 DIAGNOSIS — G8929 Other chronic pain: Secondary | ICD-10-CM

## 2022-02-17 DIAGNOSIS — M25561 Pain in right knee: Secondary | ICD-10-CM

## 2022-02-17 NOTE — Progress Notes (Signed)
PROCEDURE NOTE: ? ?The patient requests injections of the left knee , verbal consent was obtained. ? ?The left knee was prepped appropriately after time out was performed.  ? ?Sterile technique was observed and injection of 1 cc of DepoMedrol 40 mg with several cc's of plain xylocaine. Anesthesia was provided by ethyl chloride and a 20-gauge needle was used to inject the knee area. The injection was tolerated well.  A band aid dressing was applied. ? ?The patient was advised to apply ice later today and tomorrow to the injection sight as needed. ? ?PROCEDURE NOTE: ? ?The patient requests injections of the right knee , verbal consent was obtained. ? ?The right knee was prepped appropriately after time out was performed.  ? ?Sterile technique was observed and injection of 1 cc of DepoMedrol '40mg'$  with several cc's of plain xylocaine. Anesthesia was provided by ethyl chloride and a 20-gauge needle was used to inject the knee area. The injection was tolerated well.  A band aid dressing was applied. ? ?The patient was advised to apply ice later today and tomorrow to the injection sight as needed. ? ?Encounter Diagnosis  ?Name Primary?  ? Chronic pain of both knees Yes  ? ?Return in six weeks. ? ?Call if any problem. ? ?Precautions discussed. ? ?Electronically Signed ?Sanjuana Kava, MD ?3/14/20232:03 PM ? ?

## 2022-03-11 DIAGNOSIS — M1711 Unilateral primary osteoarthritis, right knee: Secondary | ICD-10-CM | POA: Diagnosis not present

## 2022-03-31 ENCOUNTER — Ambulatory Visit (INDEPENDENT_AMBULATORY_CARE_PROVIDER_SITE_OTHER): Payer: Medicare Other | Admitting: Orthopaedic Surgery

## 2022-03-31 ENCOUNTER — Encounter: Payer: Self-pay | Admitting: Orthopaedic Surgery

## 2022-03-31 DIAGNOSIS — G8929 Other chronic pain: Secondary | ICD-10-CM

## 2022-03-31 DIAGNOSIS — M25562 Pain in left knee: Secondary | ICD-10-CM | POA: Diagnosis not present

## 2022-03-31 DIAGNOSIS — M25512 Pain in left shoulder: Secondary | ICD-10-CM

## 2022-03-31 NOTE — Progress Notes (Signed)
PROCEDURE NOTE: ? ?The patient requests injections of the left knee , verbal consent was obtained. ? ?The left knee was prepped appropriately after time out was performed.  ? ?Sterile technique was observed and injection of 1 cc of DepoMedrol 40 mg with several cc's of plain xylocaine. Anesthesia was provided by ethyl chloride and a 20-gauge needle was used to inject the knee area. The injection was tolerated well.  A band aid dressing was applied. ? ?The patient was advised to apply ice later today and tomorrow to the injection sight as needed.  ? ?PROCEDURE NOTE: ? ?The patient request injection, verbal consent was obtained. ? ?The left shoulder was prepped appropriately after time out was performed.  ? ?Sterile technique was observed and injection of 1 cc of DepoMedrol '40mg'$  with several cc's of plain xylocaine. Anesthesia was provided by ethyl chloride and a 20-gauge needle was used to inject the shoulder area. A posterior approach was used.  The injection was tolerated well. ? ?A band aid dressing was applied. ? ?The patient was advised to apply ice later today and tomorrow to the injection sight as needed. ?  ?Encounter Diagnoses  ?Name Primary?  ? Chronic pain of left knee Yes  ? Chronic pain in left shoulder   ? ?Return in six weeks. ? ?Call if any problem. ? ?Precautions discussed. ? ?Electronically Signed ?Sanjuana Kava, MD ?4/25/20232:34 PM ? ?

## 2022-04-01 ENCOUNTER — Telehealth: Payer: Self-pay | Admitting: Orthopaedic Surgery

## 2022-04-01 MED ORDER — TRAMADOL HCL 50 MG PO TABS: ORAL_TABLET | ORAL | 2 refills | Status: DC

## 2022-04-01 NOTE — Telephone Encounter (Signed)
Patient is in the office and said [facility] [address] doesn't have it the script for the medication  ? ?traMADol (ULTRAM) 50 MG tablet ? ?Please send to [facility] on [address] ?

## 2022-04-03 DIAGNOSIS — M1712 Unilateral primary osteoarthritis, left knee: Secondary | ICD-10-CM | POA: Diagnosis not present

## 2022-04-14 ENCOUNTER — Ambulatory Visit (INDEPENDENT_AMBULATORY_CARE_PROVIDER_SITE_OTHER): Payer: Medicare Other | Admitting: Orthopaedic Surgery

## 2022-04-14 ENCOUNTER — Encounter: Payer: Self-pay | Admitting: Orthopaedic Surgery

## 2022-04-14 DIAGNOSIS — Z6841 Body Mass Index (BMI) 40.0 and over, adult: Secondary | ICD-10-CM

## 2022-04-14 DIAGNOSIS — M25561 Pain in right knee: Secondary | ICD-10-CM

## 2022-04-14 DIAGNOSIS — G8929 Other chronic pain: Secondary | ICD-10-CM | POA: Diagnosis not present

## 2022-04-14 NOTE — Progress Notes (Signed)
PROCEDURE NOTE: ? ?The patient requests injections of the right knee , verbal consent was obtained. ? ?The right knee was prepped appropriately after time out was performed.  ? ?Sterile technique was observed and injection of 1 cc of DepoMedrol '40mg'$  with several cc's of plain xylocaine. Anesthesia was provided by ethyl chloride and a 20-gauge needle was used to inject the knee area. The injection was tolerated well.  A band aid dressing was applied. ? ?The patient was advised to apply ice later today and tomorrow to the injection sight as needed. ? ?Encounter Diagnoses  ?Name Primary?  ? Chronic pain of right knee Yes  ? Body mass index 40.0-44.9, adult ([place])   ? Morbid obesity ([place])   ? ?Return in six weeks. ? ?Call if any problem. ? ?Precautions discussed. ? ?Electronically Signed ?Sanjuana Kava, MD ?5/9/20233:08 PM ? ?

## 2022-04-30 DIAGNOSIS — M1712 Unilateral primary osteoarthritis, left knee: Secondary | ICD-10-CM | POA: Diagnosis not present

## 2022-05-19 ENCOUNTER — Ambulatory Visit: Payer: Medicare Other | Admitting: Orthopaedic Surgery

## 2022-05-26 ENCOUNTER — Ambulatory Visit: Payer: Medicare Other | Admitting: Orthopaedic Surgery

## 2022-05-27 DIAGNOSIS — R319 Hematuria, unspecified: Secondary | ICD-10-CM | POA: Diagnosis not present

## 2022-06-24 DIAGNOSIS — M1712 Unilateral primary osteoarthritis, left knee: Secondary | ICD-10-CM | POA: Diagnosis not present

## 2022-07-01 DIAGNOSIS — M1712 Unilateral primary osteoarthritis, left knee: Secondary | ICD-10-CM | POA: Diagnosis not present

## 2022-07-09 DIAGNOSIS — M1712 Unilateral primary osteoarthritis, left knee: Secondary | ICD-10-CM | POA: Diagnosis not present

## 2022-07-14 ENCOUNTER — Ambulatory Visit (INDEPENDENT_AMBULATORY_CARE_PROVIDER_SITE_OTHER): Payer: Medicare Other | Admitting: Orthopaedic Surgery

## 2022-07-14 ENCOUNTER — Encounter: Payer: Self-pay | Admitting: Orthopaedic Surgery

## 2022-07-14 DIAGNOSIS — F4312 Post-traumatic stress disorder, chronic: Secondary | ICD-10-CM | POA: Insufficient documentation

## 2022-07-14 DIAGNOSIS — M25561 Pain in right knee: Secondary | ICD-10-CM

## 2022-07-14 DIAGNOSIS — M6530 Trigger finger, unspecified finger: Secondary | ICD-10-CM | POA: Insufficient documentation

## 2022-07-14 DIAGNOSIS — G8929 Other chronic pain: Secondary | ICD-10-CM

## 2022-07-14 DIAGNOSIS — N400 Enlarged prostate without lower urinary tract symptoms: Secondary | ICD-10-CM | POA: Insufficient documentation

## 2022-07-14 DIAGNOSIS — I10 Essential (primary) hypertension: Secondary | ICD-10-CM | POA: Insufficient documentation

## 2022-07-14 DIAGNOSIS — J309 Allergic rhinitis, unspecified: Secondary | ICD-10-CM | POA: Insufficient documentation

## 2022-07-14 DIAGNOSIS — Z6841 Body Mass Index (BMI) 40.0 and over, adult: Secondary | ICD-10-CM

## 2022-07-14 DIAGNOSIS — G5600 Carpal tunnel syndrome, unspecified upper limb: Secondary | ICD-10-CM | POA: Insufficient documentation

## 2022-07-14 DIAGNOSIS — Z77098 Contact with and (suspected) exposure to other hazardous, chiefly nonmedicinal, chemicals: Secondary | ICD-10-CM | POA: Insufficient documentation

## 2022-07-14 DIAGNOSIS — N529 Male erectile dysfunction, unspecified: Secondary | ICD-10-CM | POA: Insufficient documentation

## 2022-07-14 NOTE — Progress Notes (Signed)
PROCEDURE NOTE:  The patient requests injections of the right knee , verbal consent was obtained.  The right knee was prepped appropriately after time out was performed.   Sterile technique was observed and injection of 1 cc of DepoMedrol '40mg'$  with several cc's of plain xylocaine. Anesthesia was provided by ethyl chloride and a 20-gauge needle was used to inject the knee area. The injection was tolerated well.  A band aid dressing was applied.  The patient was advised to apply ice later today and tomorrow to the injection sight as needed.  Encounter Diagnoses  Name Primary?   Chronic pain of right knee Yes   Body mass index 40.0-44.9, adult (HCC)    Morbid obesity ([place])    Return in six weeks.  Call if any problem.  Precautions discussed.  Electronically Signed Sanjuana Kava, MD 8/8/20233:15 PM

## 2022-07-16 DIAGNOSIS — L658 Other specified nonscarring hair loss: Secondary | ICD-10-CM | POA: Diagnosis not present

## 2022-07-27 ENCOUNTER — Telehealth: Payer: Self-pay | Admitting: Orthopaedic Surgery

## 2022-07-27 DIAGNOSIS — M79642 Pain in left hand: Secondary | ICD-10-CM | POA: Diagnosis not present

## 2022-07-27 NOTE — Telephone Encounter (Signed)
Call received earlier this morning from patient's wife Helene Kelp, designated contact, asking if we had an appointment open today. I relayed we unfortunately do not have a Dr in clinic today. Also relayed that Dr Luna Glasgow is out of clinic this week; therefore made mention of urgent care or emergency room, and in midst of conversation, we lost connection.  Patient was called back by front office staff, 11:04am; awaiting return call.

## 2022-07-30 DIAGNOSIS — M1712 Unilateral primary osteoarthritis, left knee: Secondary | ICD-10-CM | POA: Diagnosis not present

## 2022-08-04 ENCOUNTER — Telehealth: Payer: Self-pay | Admitting: Orthopaedic Surgery

## 2022-08-05 DIAGNOSIS — M79642 Pain in left hand: Secondary | ICD-10-CM | POA: Diagnosis not present

## 2022-08-19 DIAGNOSIS — M79642 Pain in left hand: Secondary | ICD-10-CM | POA: Diagnosis not present

## 2022-08-25 ENCOUNTER — Ambulatory Visit (INDEPENDENT_AMBULATORY_CARE_PROVIDER_SITE_OTHER): Payer: Medicare Other | Admitting: Orthopaedic Surgery

## 2022-08-25 ENCOUNTER — Encounter: Payer: Self-pay | Admitting: Orthopaedic Surgery

## 2022-08-25 DIAGNOSIS — G8929 Other chronic pain: Secondary | ICD-10-CM | POA: Diagnosis not present

## 2022-08-25 DIAGNOSIS — M25561 Pain in right knee: Secondary | ICD-10-CM | POA: Diagnosis not present

## 2022-08-25 MED ORDER — TRAMADOL HCL 50 MG PO TABS: ORAL_TABLET | ORAL | 4 refills | Status: DC

## 2022-08-25 MED ORDER — METHYLPREDNISOLONE ACETATE 40 MG/ML IJ SUSP: 40.0000 mg | Freq: Once | INTRAMUSCULAR | Status: DC

## 2022-08-25 NOTE — Progress Notes (Signed)
PROCEDURE NOTE:  The patient requests injections of the right knee , verbal consent was obtained.  The right knee was prepped appropriately after time out was performed.   Sterile technique was observed and injection of 1 cc of DepoMedrol '40mg'$  with several cc's of plain xylocaine. Anesthesia was provided by ethyl chloride and a 20-gauge needle was used to inject the knee area. The injection was tolerated well.  A band aid dressing was applied.  The patient was advised to apply ice later today and tomorrow to the injection sight as needed.  Encounter Diagnosis  Name Primary?   Chronic pain of right knee Yes   I have reviewed the [place] web site prior to prescribing narcotic medicine for this patient.  Return in six weeks.  Call if any problem.  Precautions discussed.  Electronically Signed Sanjuana Kava, MD 9/19/20231:34 PM

## 2022-10-06 ENCOUNTER — Encounter: Payer: Self-pay | Admitting: Orthopaedic Surgery

## 2022-10-06 ENCOUNTER — Ambulatory Visit (INDEPENDENT_AMBULATORY_CARE_PROVIDER_SITE_OTHER): Payer: Medicare Other | Admitting: Orthopaedic Surgery

## 2022-10-06 DIAGNOSIS — Z6841 Body Mass Index (BMI) 40.0 and over, adult: Secondary | ICD-10-CM

## 2022-10-06 DIAGNOSIS — M25561 Pain in right knee: Secondary | ICD-10-CM

## 2022-10-06 DIAGNOSIS — G8929 Other chronic pain: Secondary | ICD-10-CM | POA: Diagnosis not present

## 2022-10-06 MED ORDER — METHYLPREDNISOLONE ACETATE 40 MG/ML IJ SUSP
40.0000 mg | Freq: Once | INTRAMUSCULAR | Status: AC
  Administered 2022-10-06: 40 mg via INTRA_ARTICULAR

## 2022-10-06 NOTE — Addendum Note (Signed)
Addended by: Obie Dredge A on: 10/06/2022 01:53 PM   Modules accepted: Orders

## 2022-10-06 NOTE — Progress Notes (Signed)
PROCEDURE NOTE:  The patient requests injections of the right knee , verbal consent was obtained.  The right knee was prepped appropriately after time out was performed.   Sterile technique was observed and injection of 1 cc of DepoMedrol '40mg'$  with several cc's of plain xylocaine. Anesthesia was provided by ethyl chloride and a 20-gauge needle was used to inject the knee area. The injection was tolerated well.  A band aid dressing was applied.  The patient was advised to apply ice later today and tomorrow to the injection sight as needed.  Encounter Diagnoses  Name Primary?   Chronic pain of right knee Yes   Body mass index 40.0-44.9, adult (HCC)    Morbid obesity ([place])    Return in six weeks.  Call if any problem.  Precautions discussed.  Electronically Signed Sanjuana Kava, MD 10/31/20231:34 PM

## 2022-11-10 DIAGNOSIS — E782 Mixed hyperlipidemia: Secondary | ICD-10-CM | POA: Diagnosis not present

## 2022-11-10 DIAGNOSIS — Z0001 Encounter for general adult medical examination with abnormal findings: Secondary | ICD-10-CM | POA: Diagnosis not present

## 2022-11-10 DIAGNOSIS — R7309 Other abnormal glucose: Secondary | ICD-10-CM | POA: Diagnosis not present

## 2022-11-10 DIAGNOSIS — I10 Essential (primary) hypertension: Secondary | ICD-10-CM | POA: Diagnosis not present

## 2022-11-10 DIAGNOSIS — G4730 Sleep apnea, unspecified: Secondary | ICD-10-CM | POA: Diagnosis not present

## 2022-11-17 ENCOUNTER — Encounter: Payer: Self-pay | Admitting: Orthopaedic Surgery

## 2022-11-17 ENCOUNTER — Ambulatory Visit (INDEPENDENT_AMBULATORY_CARE_PROVIDER_SITE_OTHER): Payer: Medicare Other | Admitting: Orthopaedic Surgery

## 2022-11-17 VITALS — BP 164/104 | HR 100 | Ht 70.0 in | Wt 322.0 lb

## 2022-11-17 DIAGNOSIS — M25512 Pain in left shoulder: Secondary | ICD-10-CM

## 2022-11-17 DIAGNOSIS — M25562 Pain in left knee: Secondary | ICD-10-CM

## 2022-11-17 DIAGNOSIS — M25561 Pain in right knee: Secondary | ICD-10-CM

## 2022-11-17 DIAGNOSIS — G8929 Other chronic pain: Secondary | ICD-10-CM

## 2022-11-17 MED ORDER — METHYLPREDNISOLONE ACETATE 40 MG/ML IJ SUSP
40.0000 mg | Freq: Once | INTRAMUSCULAR | Status: AC
  Administered 2022-11-17: 40 mg via INTRA_ARTICULAR

## 2022-11-17 NOTE — Addendum Note (Signed)
Addended by: Obie Dredge A on: 11/17/2022 03:39 PM   Modules accepted: Orders

## 2022-11-17 NOTE — Progress Notes (Signed)
PROCEDURE NOTE:  The patient request injection, verbal consent was obtained.  The left shoulder was prepped appropriately after time out was performed.   Sterile technique was observed and injection of 1 cc of DepoMedrol '40mg'$  with several cc's of plain xylocaine. Anesthesia was provided by ethyl chloride and a 20-gauge needle was used to inject the shoulder area. A posterior approach was used.  The injection was tolerated well.  A band aid dressing was applied.  The patient was advised to apply ice later today and tomorrow to the injection sight as needed.  PROCEDURE NOTE:  The patient requests injections of the left knee , verbal consent was obtained.  The left knee was prepped appropriately after time out was performed.   Sterile technique was observed and injection of 1 cc of DepoMedrol 40 mg with several cc's of plain xylocaine. Anesthesia was provided by ethyl chloride and a 20-gauge needle was used to inject the knee area. The injection was tolerated well.  A band aid dressing was applied.  The patient was advised to apply ice later today and tomorrow to the injection sight as needed.  Encounter Diagnoses  Name Primary?   Chronic pain of right knee Yes   Chronic pain in left shoulder    Return in six weeks.  Call if any problem.  Precautions discussed.  Electronically Signed Sanjuana Kava, MD 12/12/20232:08 PM

## 2022-12-15 ENCOUNTER — Encounter (INDEPENDENT_AMBULATORY_CARE_PROVIDER_SITE_OTHER): Payer: Medicare Other | Admitting: Internal Medicine

## 2022-12-29 ENCOUNTER — Ambulatory Visit: Payer: Medicare Other | Admitting: Orthopaedic Surgery

## 2023-01-05 ENCOUNTER — Ambulatory Visit (INDEPENDENT_AMBULATORY_CARE_PROVIDER_SITE_OTHER): Payer: Medicare Other | Admitting: Orthopaedic Surgery

## 2023-01-05 ENCOUNTER — Encounter: Payer: Self-pay | Admitting: Orthopaedic Surgery

## 2023-01-05 DIAGNOSIS — M25561 Pain in right knee: Secondary | ICD-10-CM

## 2023-01-05 DIAGNOSIS — M25562 Pain in left knee: Secondary | ICD-10-CM

## 2023-01-05 DIAGNOSIS — G8929 Other chronic pain: Secondary | ICD-10-CM

## 2023-01-05 MED ORDER — METHYLPREDNISOLONE ACETATE 40 MG/ML IJ SUSP
40.0000 mg | Freq: Once | INTRAMUSCULAR | Status: AC
  Administered 2023-01-05: 40 mg via INTRA_ARTICULAR

## 2023-01-05 MED ORDER — METHYLPREDNISOLONE ACETATE 40 MG/ML IJ SUSP: 40.0000 mg | Freq: Once | INTRAMUSCULAR | Status: DC

## 2023-01-05 NOTE — Addendum Note (Signed)
Addended by: Obie Dredge A on: 01/05/2023 04:41 PM   Modules accepted: Orders

## 2023-01-05 NOTE — Progress Notes (Signed)
PROCEDURE NOTE:  The patient requests injections of the right knee , verbal consent was obtained.  The right knee was prepped appropriately after time out was performed.   Sterile technique was observed and injection of 1 cc of DepoMedrol '40mg'$  with several cc's of plain xylocaine. Anesthesia was provided by ethyl chloride and a 20-gauge needle was used to inject the knee area. The injection was tolerated well.  A band aid dressing was applied.  The patient was advised to apply ice later today and tomorrow to the injection sight as needed.  PROCEDURE NOTE:  The patient requests injections of the left knee , verbal consent was obtained.  The left knee was prepped appropriately after time out was performed.   Sterile technique was observed and injection of 1 cc of DepoMedrol 40 mg with several cc's of plain xylocaine. Anesthesia was provided by ethyl chloride and a 20-gauge needle was used to inject the knee area. The injection was tolerated well.  A band aid dressing was applied.  The patient was advised to apply ice later today and tomorrow to the injection sight as needed.  Encounter Diagnoses  Name Primary?   Chronic pain of right knee Yes   Chronic pain of left knee    Return in six weeks.  Call if any problem.  Precautions discussed.  Electronically Signed Sanjuana Kava, MD 1/30/20241:50 PM

## 2023-01-12 ENCOUNTER — Ambulatory Visit: Payer: Medicare Other | Admitting: Orthopaedic Surgery

## 2023-01-19 ENCOUNTER — Ambulatory Visit: Payer: Medicare Other | Admitting: Orthopaedic Surgery

## 2023-01-27 ENCOUNTER — Encounter (INDEPENDENT_AMBULATORY_CARE_PROVIDER_SITE_OTHER): Payer: Self-pay | Admitting: *Deleted

## 2023-02-04 ENCOUNTER — Encounter: Payer: Self-pay | Admitting: Radiology

## 2023-02-16 ENCOUNTER — Ambulatory Visit (INDEPENDENT_AMBULATORY_CARE_PROVIDER_SITE_OTHER): Payer: Medicare Other | Admitting: Orthopaedic Surgery

## 2023-02-16 VITALS — BP 146/84 | HR 64

## 2023-02-16 DIAGNOSIS — M25562 Pain in left knee: Secondary | ICD-10-CM

## 2023-02-16 DIAGNOSIS — G8929 Other chronic pain: Secondary | ICD-10-CM | POA: Diagnosis not present

## 2023-02-16 DIAGNOSIS — M25561 Pain in right knee: Secondary | ICD-10-CM

## 2023-02-16 DIAGNOSIS — Z6841 Body Mass Index (BMI) 40.0 and over, adult: Secondary | ICD-10-CM

## 2023-02-16 MED ORDER — METHYLPREDNISOLONE ACETATE 40 MG/ML IJ SUSP
40.0000 mg | Freq: Once | INTRAMUSCULAR | Status: AC
  Administered 2023-02-16: 40 mg via INTRA_ARTICULAR

## 2023-02-16 NOTE — Progress Notes (Signed)
PROCEDURE NOTE:  The patient requests injections of the left knee , verbal consent was obtained.  The left knee was prepped appropriately after time out was performed.   Sterile technique was observed and injection of 1 cc of DepoMedrol 40 mg with several cc's of plain xylocaine. Anesthesia was provided by ethyl chloride and a 20-gauge needle was used to inject the knee area. The injection was tolerated well.  A band aid dressing was applied.  The patient was advised to apply ice later today and tomorrow to the injection sight as needed.  PROCEDURE NOTE:  The patient requests injections of the right knee , verbal consent was obtained.  The right knee was prepped appropriately after time out was performed.   Sterile technique was observed and injection of 1 cc of DepoMedrol '40mg'$  with several cc's of plain xylocaine. Anesthesia was provided by ethyl chloride and a 20-gauge needle was used to inject the knee area. The injection was tolerated well.  A band aid dressing was applied.  The patient was advised to apply ice later today and tomorrow to the injection sight as needed.  Encounter Diagnoses  Name Primary?   Chronic pain of right knee Yes   Chronic pain of left knee    Body mass index 40.0-44.9, adult ([place])    Return in six weeks.  Call if any problem.  Precautions discussed.  Electronically Signed Sanjuana Kava, MD 3/12/20242:16 PM

## 2023-02-16 NOTE — Addendum Note (Signed)
Addended by: Obie Dredge A on: 02/16/2023 04:23 PM   Modules accepted: Orders

## 2023-02-19 ENCOUNTER — Inpatient Hospital Stay (HOSPITAL_COMMUNITY): Payer: Medicare Other

## 2023-02-19 ENCOUNTER — Other Ambulatory Visit (HOSPITAL_COMMUNITY): Payer: Medicare Other

## 2023-02-19 ENCOUNTER — Encounter (HOSPITAL_COMMUNITY): Payer: Self-pay | Admitting: Internal Medicine

## 2023-02-19 ENCOUNTER — Inpatient Hospital Stay (HOSPITAL_COMMUNITY)
Admission: EM | Admit: 2023-02-19 | Discharge: 2023-03-08 | DRG: 082 | Disposition: E | Payer: Medicare Other | Attending: Internal Medicine | Admitting: Internal Medicine

## 2023-02-19 ENCOUNTER — Emergency Department (HOSPITAL_COMMUNITY): Payer: Medicare Other

## 2023-02-19 DIAGNOSIS — R0689 Other abnormalities of breathing: Secondary | ICD-10-CM | POA: Diagnosis not present

## 2023-02-19 DIAGNOSIS — G935 Compression of brain: Secondary | ICD-10-CM | POA: Diagnosis present

## 2023-02-19 DIAGNOSIS — Z452 Encounter for adjustment and management of vascular access device: Secondary | ICD-10-CM | POA: Diagnosis not present

## 2023-02-19 DIAGNOSIS — I493 Ventricular premature depolarization: Secondary | ICD-10-CM | POA: Diagnosis present

## 2023-02-19 DIAGNOSIS — Z743 Need for continuous supervision: Secondary | ICD-10-CM | POA: Diagnosis not present

## 2023-02-19 DIAGNOSIS — F4310 Post-traumatic stress disorder, unspecified: Secondary | ICD-10-CM | POA: Diagnosis present

## 2023-02-19 DIAGNOSIS — J810 Acute pulmonary edema: Secondary | ICD-10-CM

## 2023-02-19 DIAGNOSIS — I469 Cardiac arrest, cause unspecified: Secondary | ICD-10-CM | POA: Diagnosis not present

## 2023-02-19 DIAGNOSIS — Z6841 Body Mass Index (BMI) 40.0 and over, adult: Secondary | ICD-10-CM | POA: Diagnosis not present

## 2023-02-19 DIAGNOSIS — Z66 Do not resuscitate: Secondary | ICD-10-CM | POA: Diagnosis not present

## 2023-02-19 DIAGNOSIS — E7800 Pure hypercholesterolemia, unspecified: Secondary | ICD-10-CM | POA: Diagnosis present

## 2023-02-19 DIAGNOSIS — E876 Hypokalemia: Secondary | ICD-10-CM | POA: Diagnosis present

## 2023-02-19 DIAGNOSIS — R402112 Coma scale, eyes open, never, at arrival to emergency department: Secondary | ICD-10-CM | POA: Diagnosis present

## 2023-02-19 DIAGNOSIS — W19XXXA Unspecified fall, initial encounter: Secondary | ICD-10-CM | POA: Diagnosis present

## 2023-02-19 DIAGNOSIS — I2510 Atherosclerotic heart disease of native coronary artery without angina pectoris: Secondary | ICD-10-CM | POA: Diagnosis not present

## 2023-02-19 DIAGNOSIS — R570 Cardiogenic shock: Secondary | ICD-10-CM | POA: Diagnosis present

## 2023-02-19 DIAGNOSIS — R34 Anuria and oliguria: Secondary | ICD-10-CM | POA: Diagnosis present

## 2023-02-19 DIAGNOSIS — I4901 Ventricular fibrillation: Secondary | ICD-10-CM | POA: Diagnosis not present

## 2023-02-19 DIAGNOSIS — R55 Syncope and collapse: Secondary | ICD-10-CM | POA: Diagnosis not present

## 2023-02-19 DIAGNOSIS — J9601 Acute respiratory failure with hypoxia: Secondary | ICD-10-CM | POA: Diagnosis present

## 2023-02-19 DIAGNOSIS — E871 Hypo-osmolality and hyponatremia: Secondary | ICD-10-CM | POA: Diagnosis present

## 2023-02-19 DIAGNOSIS — S199XXA Unspecified injury of neck, initial encounter: Secondary | ICD-10-CM | POA: Diagnosis not present

## 2023-02-19 DIAGNOSIS — Z825 Family history of asthma and other chronic lower respiratory diseases: Secondary | ICD-10-CM

## 2023-02-19 DIAGNOSIS — I613 Nontraumatic intracerebral hemorrhage in brain stem: Secondary | ICD-10-CM | POA: Diagnosis present

## 2023-02-19 DIAGNOSIS — F419 Anxiety disorder, unspecified: Secondary | ICD-10-CM | POA: Diagnosis present

## 2023-02-19 DIAGNOSIS — E872 Acidosis, unspecified: Secondary | ICD-10-CM | POA: Diagnosis present

## 2023-02-19 DIAGNOSIS — R402312 Coma scale, best motor response, none, at arrival to emergency department: Secondary | ICD-10-CM | POA: Diagnosis not present

## 2023-02-19 DIAGNOSIS — G9382 Brain death: Secondary | ICD-10-CM | POA: Diagnosis not present

## 2023-02-19 DIAGNOSIS — I472 Ventricular tachycardia, unspecified: Secondary | ICD-10-CM | POA: Diagnosis not present

## 2023-02-19 DIAGNOSIS — G936 Cerebral edema: Secondary | ICD-10-CM | POA: Diagnosis present

## 2023-02-19 DIAGNOSIS — J45909 Unspecified asthma, uncomplicated: Secondary | ICD-10-CM | POA: Diagnosis present

## 2023-02-19 DIAGNOSIS — I462 Cardiac arrest due to underlying cardiac condition: Secondary | ICD-10-CM | POA: Diagnosis not present

## 2023-02-19 DIAGNOSIS — I499 Cardiac arrhythmia, unspecified: Secondary | ICD-10-CM

## 2023-02-19 DIAGNOSIS — N179 Acute kidney failure, unspecified: Secondary | ICD-10-CM | POA: Diagnosis not present

## 2023-02-19 DIAGNOSIS — I5021 Acute systolic (congestive) heart failure: Secondary | ICD-10-CM | POA: Diagnosis present

## 2023-02-19 DIAGNOSIS — R579 Shock, unspecified: Secondary | ICD-10-CM

## 2023-02-19 DIAGNOSIS — Z85818 Personal history of malignant neoplasm of other sites of lip, oral cavity, and pharynx: Secondary | ICD-10-CM

## 2023-02-19 DIAGNOSIS — Z79899 Other long term (current) drug therapy: Secondary | ICD-10-CM

## 2023-02-19 DIAGNOSIS — S066X0A Traumatic subarachnoid hemorrhage without loss of consciousness, initial encounter: Secondary | ICD-10-CM | POA: Diagnosis not present

## 2023-02-19 DIAGNOSIS — R402212 Coma scale, best verbal response, none, at arrival to emergency department: Secondary | ICD-10-CM | POA: Diagnosis not present

## 2023-02-19 DIAGNOSIS — R4182 Altered mental status, unspecified: Secondary | ICD-10-CM | POA: Diagnosis not present

## 2023-02-19 DIAGNOSIS — E669 Obesity, unspecified: Secondary | ICD-10-CM | POA: Diagnosis present

## 2023-02-19 DIAGNOSIS — I110 Hypertensive heart disease with heart failure: Secondary | ICD-10-CM | POA: Diagnosis not present

## 2023-02-19 DIAGNOSIS — Z808 Family history of malignant neoplasm of other organs or systems: Secondary | ICD-10-CM

## 2023-02-19 DIAGNOSIS — Z87891 Personal history of nicotine dependence: Secondary | ICD-10-CM

## 2023-02-19 DIAGNOSIS — Z4682 Encounter for fitting and adjustment of non-vascular catheter: Secondary | ICD-10-CM | POA: Diagnosis not present

## 2023-02-19 DIAGNOSIS — K219 Gastro-esophageal reflux disease without esophagitis: Secondary | ICD-10-CM | POA: Diagnosis present

## 2023-02-19 DIAGNOSIS — Z888 Allergy status to other drugs, medicaments and biological substances status: Secondary | ICD-10-CM

## 2023-02-19 DIAGNOSIS — I4891 Unspecified atrial fibrillation: Secondary | ICD-10-CM | POA: Diagnosis present

## 2023-02-19 DIAGNOSIS — Z515 Encounter for palliative care: Secondary | ICD-10-CM

## 2023-02-19 DIAGNOSIS — G4733 Obstructive sleep apnea (adult) (pediatric): Secondary | ICD-10-CM | POA: Diagnosis present

## 2023-02-19 DIAGNOSIS — R918 Other nonspecific abnormal finding of lung field: Secondary | ICD-10-CM | POA: Diagnosis not present

## 2023-02-19 DIAGNOSIS — R079 Chest pain, unspecified: Secondary | ICD-10-CM | POA: Diagnosis not present

## 2023-02-19 DIAGNOSIS — G8929 Other chronic pain: Secondary | ICD-10-CM | POA: Diagnosis present

## 2023-02-19 DIAGNOSIS — R739 Hyperglycemia, unspecified: Secondary | ICD-10-CM | POA: Diagnosis present

## 2023-02-19 DIAGNOSIS — R404 Transient alteration of awareness: Secondary | ICD-10-CM | POA: Diagnosis not present

## 2023-02-19 DIAGNOSIS — Z880 Allergy status to penicillin: Secondary | ICD-10-CM

## 2023-02-19 LAB — GLUCOSE, CAPILLARY
Glucose-Capillary: 344 mg/dL — ABNORMAL HIGH (ref 70–99)
Glucose-Capillary: 387 mg/dL — ABNORMAL HIGH (ref 70–99)
Glucose-Capillary: 310 mg/dL — ABNORMAL HIGH (ref 70–99)

## 2023-02-19 LAB — COMPREHENSIVE METABOLIC PANEL
ALT: 38 U/L (ref 0–44)
AST: 53 U/L — ABNORMAL HIGH (ref 15–41)
Albumin: 3 g/dL — ABNORMAL LOW (ref 3.5–5.0)
Alkaline Phosphatase: 68 U/L (ref 38–126)
Anion gap: 10 (ref 5–15)
BUN: 17 mg/dL (ref 8–23)
CO2: 27 mmol/L (ref 22–32)
Calcium: 7.8 mg/dL — ABNORMAL LOW (ref 8.9–10.3)
Chloride: 100 mmol/L (ref 98–111)
Creatinine, Ser: 1.93 mg/dL — ABNORMAL HIGH (ref 0.61–1.24)
GFR, Estimated: 35 mL/min — ABNORMAL LOW (ref >60)
Glucose, Bld: 351 mg/dL — ABNORMAL HIGH (ref 70–99)
Potassium: 4.6 mmol/L (ref 3.5–5.1)
Sodium: 137 mmol/L (ref 135–145)
Total Bilirubin: 0.7 mg/dL (ref 0.3–1.2)
Total Protein: 6 g/dL — ABNORMAL LOW (ref 6.5–8.1)

## 2023-02-19 LAB — POCT I-STAT 7, (LYTES, BLD GAS, ICA,H+H)
Acid-base deficit: 5 mmol/L — ABNORMAL HIGH (ref 0.0–2.0)
Acid-base deficit: 6 mmol/L — ABNORMAL HIGH (ref 0.0–2.0)
Bicarbonate: 23.8 mmol/L (ref 20.0–28.0)
Bicarbonate: 22.6 mmol/L (ref 20.0–28.0)
Calcium, Ion: 1.18 mmol/L (ref 1.15–1.40)
Calcium, Ion: 1.15 mmol/L (ref 1.15–1.40)
HCT: 50 % (ref 39.0–52.0)
HCT: 49 % (ref 39.0–52.0)
Hemoglobin: 17 g/dL (ref 13.0–17.0)
Hemoglobin: 16.7 g/dL (ref 13.0–17.0)
O2 Saturation: 86 %
O2 Saturation: 95 %
Patient temperature: 94
Patient temperature: 36.2
Potassium: 4.6 mmol/L (ref 3.5–5.1)
Potassium: 4.5 mmol/L (ref 3.5–5.1)
Sodium: 134 mmol/L — ABNORMAL LOW (ref 135–145)
Sodium: 135 mmol/L (ref 135–145)
TCO2: 26 mmol/L (ref 22–32)
TCO2: 24 mmol/L (ref 22–32)
pCO2 arterial: 52.2 mmHg — ABNORMAL HIGH (ref 32–48)
pCO2 arterial: 54.3 mmHg — ABNORMAL HIGH (ref 32–48)
pH, Arterial: 7.254 — ABNORMAL LOW (ref 7.35–7.45)
pH, Arterial: 7.223 — ABNORMAL LOW (ref 7.35–7.45)
pO2, Arterial: 52 mmHg — ABNORMAL LOW (ref 83–108)
pO2, Arterial: 90 mmHg (ref 83–108)

## 2023-02-19 LAB — TROPONIN I (HIGH SENSITIVITY): Troponin I (High Sensitivity): 19 ng/L — ABNORMAL HIGH (ref <18)

## 2023-02-19 LAB — CBC
HCT: 52.1 % — ABNORMAL HIGH (ref 39.0–52.0)
Hemoglobin: 15.2 g/dL (ref 13.0–17.0)
MCH: 27 pg (ref 26.0–34.0)
MCHC: 29.2 g/dL — ABNORMAL LOW (ref 30.0–36.0)
MCV: 92.5 fL (ref 80.0–100.0)
Platelets: 124 10*3/uL — ABNORMAL LOW (ref 150–400)
RBC: 5.63 MIL/uL (ref 4.22–5.81)
RDW: 15.7 % — ABNORMAL HIGH (ref 11.5–15.5)
WBC: 11.7 10*3/uL — ABNORMAL HIGH (ref 4.0–10.5)
nRBC: 0 % (ref 0.0–0.2)

## 2023-02-19 LAB — BASIC METABOLIC PANEL
Anion gap: 13 (ref 5–15)
BUN: 14 mg/dL (ref 8–23)
CO2: 16 mmol/L — ABNORMAL LOW (ref 22–32)
Calcium: 8.1 mg/dL — ABNORMAL LOW (ref 8.9–10.3)
Chloride: 102 mmol/L (ref 98–111)
Creatinine, Ser: 1.35 mg/dL — ABNORMAL HIGH (ref 0.61–1.24)
GFR, Estimated: 54 mL/min — ABNORMAL LOW (ref >60)
Glucose, Bld: 318 mg/dL — ABNORMAL HIGH (ref 70–99)
Potassium: 3.2 mmol/L — ABNORMAL LOW (ref 3.5–5.1)
Sodium: 131 mmol/L — ABNORMAL LOW (ref 135–145)

## 2023-02-19 LAB — I-STAT CHEM 8, ED
BUN: 16 mg/dL (ref 8–23)
Calcium, Ion: 0.99 mmol/L — ABNORMAL LOW (ref 1.15–1.40)
Chloride: 103 mmol/L (ref 98–111)
Creatinine, Ser: 1.2 mg/dL (ref 0.61–1.24)
Glucose, Bld: 344 mg/dL — ABNORMAL HIGH (ref 70–99)
HCT: 53 % — ABNORMAL HIGH (ref 39.0–52.0)
Hemoglobin: 18 g/dL — ABNORMAL HIGH (ref 13.0–17.0)
Potassium: 3.3 mmol/L — ABNORMAL LOW (ref 3.5–5.1)
Sodium: 134 mmol/L — ABNORMAL LOW (ref 135–145)
TCO2: 18 mmol/L — ABNORMAL LOW (ref 22–32)

## 2023-02-19 LAB — CBG MONITORING, ED
Glucose-Capillary: 72 mg/dL (ref 70–99)
Glucose-Capillary: 172 mg/dL — ABNORMAL HIGH (ref 70–99)

## 2023-02-19 LAB — COOXEMETRY PANEL
Carboxyhemoglobin: 1.9 % — ABNORMAL HIGH (ref 0.5–1.5)
Methemoglobin: <0.7 % (ref 0.0–1.5)
O2 Saturation: 88.2 %
Total hemoglobin: 14.9 g/dL (ref 12.0–16.0)

## 2023-02-19 LAB — BRAIN NATRIURETIC PEPTIDE
B Natriuretic Peptide: 53 pg/mL (ref 0.0–100.0)
B Natriuretic Peptide: 390 pg/mL — ABNORMAL HIGH (ref 0.0–100.0)

## 2023-02-19 LAB — LACTIC ACID, PLASMA: Lactic Acid, Venous: 4.1 mmol/L (ref 0.5–1.9)

## 2023-02-19 MED ORDER — AMIODARONE HCL IN DEXTROSE 360-4.14 MG/200ML-% IV SOLN
60.0000 mg/h | INTRAVENOUS | Status: AC
  Administered 2023-02-19 (×2): 60 mg/h via INTRAVENOUS
  Filled 2023-02-19 (×2): qty 200

## 2023-02-19 MED ORDER — LACTATED RINGERS IV SOLN: INTRAVENOUS | Status: DC

## 2023-02-19 MED ORDER — NOREPINEPHRINE 4 MG/250ML-% IV SOLN
0.0000 ug/min | INTRAVENOUS | Status: DC
  Administered 2023-02-19: 35 ug/min via INTRAVENOUS
  Administered 2023-02-19: 2 ug/min via INTRAVENOUS
  Administered 2023-02-19: 35 ug/min via INTRAVENOUS
  Administered 2023-02-19 (×2): 40 ug/min via INTRAVENOUS
  Administered 2023-02-20: 37.067 ug/min via INTRAVENOUS
  Administered 2023-02-20: 34.933 ug/min via INTRAVENOUS
  Administered 2023-02-20: 37.067 ug/min via INTRAVENOUS
  Administered 2023-02-20: 38.133 ug/min via INTRAVENOUS
  Filled 2023-02-19 (×2): qty 250
  Filled 2023-02-19 (×3): qty 500

## 2023-02-19 MED ORDER — ACETAMINOPHEN 650 MG RE SUPP: 650.0000 mg | RECTAL | Status: DC | PRN

## 2023-02-19 MED ORDER — SODIUM CHLORIDE 0.9 % IV SOLN: INTRAVENOUS | Status: DC | PRN

## 2023-02-19 MED ORDER — ACETAMINOPHEN 325 MG PO TABS: 650.0000 mg | ORAL_TABLET | ORAL | Status: DC

## 2023-02-19 MED ORDER — FAMOTIDINE 20 MG PO TABS
20.0000 mg | ORAL_TABLET | Freq: Two times a day (BID) | ORAL | Status: DC
  Administered 2023-02-20: 20 mg
  Filled 2023-02-19: qty 1

## 2023-02-19 MED ORDER — BUSPIRONE HCL 10 MG PO TABS: 30.0000 mg | ORAL_TABLET | Freq: Three times a day (TID) | ORAL | Status: DC | PRN

## 2023-02-19 MED ORDER — MAGNESIUM SULFATE 2 GM/50ML IV SOLN
2.0000 g | Freq: Once | INTRAVENOUS | Status: DC
  Filled 2023-02-19: qty 50

## 2023-02-19 MED ORDER — AMIODARONE LOAD VIA INFUSION
150.0000 mg | Freq: Once | INTRAVENOUS | Status: AC
  Administered 2023-02-19: 150 mg via INTRAVENOUS
  Filled 2023-02-19: qty 83.34

## 2023-02-19 MED ORDER — SODIUM CHLORIDE 0.9% FLUSH
10.0000 mL | Freq: Two times a day (BID) | INTRAVENOUS | Status: DC
  Administered 2023-02-19: 10 mL

## 2023-02-19 MED ORDER — POTASSIUM CHLORIDE 20 MEQ PO PACK
20.0000 meq | PACK | Freq: Once | ORAL | Status: AC
  Administered 2023-02-19: 20 meq
  Filled 2023-02-19: qty 1

## 2023-02-19 MED ORDER — POLYETHYLENE GLYCOL 3350 17 G PO PACK
17.0000 g | PACK | Freq: Every day | ORAL | Status: DC
  Administered 2023-02-20: 17 g
  Filled 2023-02-19: qty 1

## 2023-02-19 MED ORDER — CALCIUM GLUCONATE-NACL 2-0.675 GM/100ML-% IV SOLN: 2.0000 g | Freq: Once | INTRAVENOUS | Status: DC

## 2023-02-19 MED ORDER — ACETAMINOPHEN 160 MG/5ML PO SOLN
650.0000 mg | ORAL | Status: DC
  Administered 2023-02-19 – 2023-02-20 (×5): 650 mg
  Filled 2023-02-19 (×4): qty 20.3

## 2023-02-19 MED ORDER — EPINEPHRINE 1 MG/10ML IJ SOSY
PREFILLED_SYRINGE | INTRAMUSCULAR | Status: AC
  Filled 2023-02-19: qty 30

## 2023-02-19 MED ORDER — MILRINONE LACTATE IN DEXTROSE 20-5 MG/100ML-% IV SOLN
0.3750 ug/kg/min | INTRAVENOUS | Status: DC
  Administered 2023-02-19: 0.375 ug/kg/min via INTRAVENOUS

## 2023-02-19 MED ORDER — ACETAMINOPHEN 650 MG RE SUPP: 650.0000 mg | RECTAL | Status: DC

## 2023-02-19 MED ORDER — EPINEPHRINE HCL 5 MG/250ML IV SOLN IN NS
0.5000 ug/min | INTRAVENOUS | Status: DC
  Administered 2023-02-19: 0.5 ug/min via INTRAVENOUS
  Administered 2023-02-19: 9 ug/min via INTRAVENOUS
  Administered 2023-02-20: 8 ug/min via INTRAVENOUS
  Administered 2023-02-20: 9 ug/min via INTRAVENOUS
  Filled 2023-02-19 (×2): qty 250

## 2023-02-19 MED ORDER — HEPARIN SODIUM (PORCINE) 5000 UNIT/ML IJ SOLN
5000.0000 [IU] | Freq: Three times a day (TID) | INTRAMUSCULAR | Status: DC
  Administered 2023-02-19 – 2023-02-20 (×2): 5000 [IU] via SUBCUTANEOUS
  Filled 2023-02-19 (×2): qty 1

## 2023-02-19 MED ORDER — DOCUSATE SODIUM 100 MG PO CAPS: 100.0000 mg | ORAL_CAPSULE | Freq: Two times a day (BID) | ORAL | Status: DC | PRN

## 2023-02-19 MED ORDER — ONDANSETRON HCL 4 MG/2ML IJ SOLN: 4.0000 mg | Freq: Four times a day (QID) | INTRAMUSCULAR | Status: DC | PRN

## 2023-02-19 MED ORDER — DOCUSATE SODIUM 50 MG/5ML PO LIQD
100.0000 mg | Freq: Two times a day (BID) | ORAL | Status: DC
  Administered 2023-02-20: 100 mg
  Filled 2023-02-19: qty 10

## 2023-02-19 MED ORDER — MAGNESIUM SULFATE 2 GM/50ML IV SOLN: 2.0000 g | Freq: Once | INTRAVENOUS | Status: DC | PRN

## 2023-02-19 MED ORDER — SODIUM BICARBONATE 8.4 % IV SOLN
100.0000 meq | Freq: Once | INTRAVENOUS | Status: AC
  Administered 2023-02-19: 100 meq via INTRAVENOUS

## 2023-02-19 MED ORDER — POLYETHYLENE GLYCOL 3350 17 G PO PACK: 17.0000 g | PACK | Freq: Every day | ORAL | Status: DC | PRN

## 2023-02-19 MED ORDER — ACETAMINOPHEN 325 MG PO TABS: 650.0000 mg | ORAL_TABLET | ORAL | Status: DC | PRN

## 2023-02-19 MED ORDER — MAGNESIUM SULFATE 2 GM/50ML IV SOLN
2.0000 g | INTRAVENOUS | Status: AC
  Administered 2023-02-19: 2 g via INTRAVENOUS
  Filled 2023-02-19: qty 50

## 2023-02-19 MED ORDER — SODIUM CHLORIDE 0.9 % IV SOLN: 250.0000 mL | INTRAVENOUS | Status: DC

## 2023-02-19 MED ORDER — VASOPRESSIN 20 UNITS/100 ML INFUSION FOR SHOCK
0.0000 [IU]/min | INTRAVENOUS | Status: DC
  Administered 2023-02-19 – 2023-02-20 (×3): 0.03 [IU]/min via INTRAVENOUS
  Filled 2023-02-19 (×2): qty 100

## 2023-02-19 MED ORDER — PANTOPRAZOLE SODIUM 40 MG IV SOLR
40.0000 mg | Freq: Every day | INTRAVENOUS | Status: DC
  Administered 2023-02-19: 40 mg via INTRAVENOUS
  Filled 2023-02-19: qty 10

## 2023-02-19 MED ORDER — SODIUM CHLORIDE 0.9% FLUSH: 10.0000 mL | INTRAVENOUS | Status: DC | PRN

## 2023-02-19 MED ORDER — POTASSIUM CHLORIDE 10 MEQ/100ML IV SOLN
10.0000 meq | INTRAVENOUS | Status: AC
  Administered 2023-02-19 (×4): 10 meq via INTRAVENOUS
  Filled 2023-02-19 (×3): qty 100

## 2023-02-19 MED ORDER — INSULIN ASPART 100 UNIT/ML IJ SOLN
0.0000 [IU] | INTRAMUSCULAR | Status: DC
  Administered 2023-02-19: 20 [IU] via SUBCUTANEOUS
  Administered 2023-02-19: 15 [IU] via SUBCUTANEOUS
  Administered 2023-02-20: 4 [IU] via SUBCUTANEOUS
  Administered 2023-02-20: 11 [IU] via SUBCUTANEOUS
  Administered 2023-02-20: 15 [IU] via SUBCUTANEOUS

## 2023-02-19 MED ORDER — AMIODARONE HCL IN DEXTROSE 360-4.14 MG/200ML-% IV SOLN
30.0000 mg/h | INTRAVENOUS | Status: DC
  Administered 2023-02-20: 30 mg/h via INTRAVENOUS
  Filled 2023-02-19: qty 200

## 2023-02-19 MED ORDER — ACETAMINOPHEN 160 MG/5ML PO SOLN: 650.0000 mg | ORAL | Status: DC | PRN

## 2023-02-19 MED ORDER — POTASSIUM CHLORIDE 10 MEQ/100ML IV SOLN
10.0000 meq | Freq: Once | INTRAVENOUS | Status: DC
  Filled 2023-02-19: qty 100

## 2023-02-19 MED ORDER — CALCIUM GLUCONATE-NACL 1-0.675 GM/50ML-% IV SOLN
1.0000 g | INTRAVENOUS | Status: AC
  Administered 2023-02-19: 1000 mg via INTRAVENOUS
  Filled 2023-02-19: qty 50

## 2023-02-19 MED ORDER — MILRINONE LACTATE IN DEXTROSE 20-5 MG/100ML-% IV SOLN: 0.3750 ug/kg/min | INTRAVENOUS | Status: DC

## 2023-02-19 MED ORDER — CHLORHEXIDINE GLUCONATE CLOTH 2 % EX PADS
6.0000 | MEDICATED_PAD | Freq: Every day | CUTANEOUS | Status: DC
  Administered 2023-02-19 – 2023-02-20 (×2): 6 via TOPICAL

## 2023-02-19 NOTE — ED Triage Notes (Signed)
Pt brought in by [facility] for cardiac arrest; family reported to ems that pt stated he felt dizzy and did not feel well, family states they then found pt lying face down in the floor  [facility] was called and pt had pulses but pulses were lost on the way to hospital. CPR was started at 1513  Upon arrival to ED pt found to be pulseless and CPR continued  Pt given epi at 1520, 1522,1524,1528 Pt given atropine at 1522  Pt intubated at 1523 with 7.5 ETT, 25 cm at the lip;  Pt cgb 72 with an amp of D50 given  ROSC obtained at 1531, pt in ST  Pulses lost at 1540 and CPR started epi given at 1542, 1551   ROSC at  1552 with pt in [place] in room at 1546

## 2023-02-19 NOTE — Consult Note (Addendum)
CARDIOLOGY CONSULT NOTE    Patient ID: Jon Whitney; [id number]; [birth year]   Admit date: 02/08/2023 Date of Consult: 02/06/2023  Primary Care Provider: Sharilyn Sites, MD Primary Cardiologist: None Primary Electrophysiologist:  None   Patient Profile:   Jon Whitney is a 77 y.o. male with a hx of asthma, OSA on CPAP who is being seen today for the evaluation of s/p cardiac arrest at the request of Dr. Sabra Heck.  History of Present Illness:   Jon Whitney is a 77 year old M known to have asthma, OSA on CPAP presented to the ER with out-of-hospital cardiac arrest.  Patient was having near syncopal episodes associated with diffuse diaphoresis a few times with the last episode today followed by syncope with face down. Initial rhythm showed ventricular arrhythmia per EMS but no strip available. His rhythm quickly changed to PEA, received CPR and epi en route to the hospital.  After arrival to the ER, he continued to be in intermittent PEA, after 5 doses of epi and continued CPR, ROSC was achieved. He was started on epinephrine drip. EKG showed atrial fibrillation with RVR. Telemetry reviewed, atrial fibrillation with RVR and frequent PVCs were noted. Chest x-ray showed bilateral pulmonary edema. Echo was performed by the ER staff at bedside which showed severely depressed LVEF.  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   GERD (gastroesophageal reflux disease)    H/O hiatal hernia    Heart murmur    as a child "it went away"   Hypertension    Psychiatric disorder    PTSD (post-traumatic stress disorder)    Sleep apnea    uses cpap    Past Surgical History:  Procedure Laterality Date   COLONOSCOPY      FOOT SURGERY Right    HAND SURGERY Right    finger amputations   MOUTH SURGERY     prosthetic soft palate       Inpatient Medications: Scheduled Meds:  EPINEPHrine       methylPREDNISolone acetate  40 mg Intra-articular Once   potassium chloride  20 mEq Per Tube Once   Continuous Infusions:  calcium gluconate     epinephrine 5.5 mcg/min (02/25/2023 1641)   magnesium sulfate bolus IVPB     magnesium sulfate bolus IVPB     milrinone 0.375 mcg/kg/min (02/17/2023 1640)   norepinephrine (LEVOPHED) Adult infusion 5 mcg/min (03/03/2023 1640)   potassium chloride     potassium chloride     PRN Meds: EPINEPHrine  Allergies:    Allergies  Allergen Reactions   Penicillins Hives and Rash   Aripiprazole     Social History:   Social History   Socioeconomic History   Marital status: Married    Spouse name: Not on file   Number of children: Not on file   Years of education: Not on file   Highest education level: Not on file  Occupational History   Not on file  Tobacco Use   Smoking status: Former    Types: Cigarettes    Quit date: 08/28/1967    Years since quitting: 55.5   Smokeless tobacco: Never  Substance and Sexual Activity   Alcohol use: Yes  Comment: occasionally   Drug use: No   Sexual activity: Never  Other Topics Concern   Not on file  Social History Narrative   Not on file   Social Determinants of Health   Financial Resource Strain: Not on file  Food Insecurity: Not on file  Transportation Needs: Not on file  Physical Activity: Not on file  Stress: Not on file  Social Connections: Not on file  Intimate Partner Violence: Not on file    Family History:    Family History  Problem Relation Age of Onset   Asthma Mother    Pneumonia Mother    Bone cancer Father      ROS:  Please see the history of present illness.  ROS  All other ROS reviewed and negative.     Physical Exam/Data:   Vitals:   02/11/2023 1636 02/13/2023 1640 02/06/2023 1645  02/24/2023 1645  BP:  (!) 79/56 (!) 83/53   Pulse:  (!) 110 (!) 111   Resp:  (!) 8    SpO2: 100% 99% 98%   Weight:    (!) 145.2 kg  Height:    5\' 10"  (1.778 m)    Intake/Output Summary (Last 24 hours) at 03/06/2023 1711 Last data filed at 02/18/2023 1641 Gross per 24 hour  Intake 9.95 ml  Output --  Net 9.95 ml   Filed Weights   02/24/2023 1600 02/07/2023 1645  Weight: (!) 145.2 kg (!) 145.2 kg   Body mass index is 45.92 kg/m.  General:  Well nourished, well developed, in no acute distress HEENT: normal Lymph: no adenopathy Neck: Unable to examine JVD due to body habitus Endocrine:  No thryomegaly Vascular: No carotid bruits; FA pulses 2+ bilaterally without bruits  Cardiac:  normal S1, S2; RRR; no murmur  Lungs:  clear to auscultation bilaterally, no wheezing, rhonchi or rales  Abd: soft, nontender, no hepatomegaly  Ext: 1-2+ pitting edema in bilateral lower extremities Musculoskeletal:  No deformities, BUE and BLE strength normal and equal Skin: warm and dry  Neuro: Patient intubated  EKG:  The EKG was personally reviewed and demonstrates: Atrial fibrillation with RVR Telemetry:  Telemetry was personally reviewed and demonstrates: Atrial fibrillation with RVR, heart rate 100-110s  Relevant CV Studies   Laboratory Data:  Chemistry Recent Labs  Lab 02/13/2023 1540 03/02/2023 1554  NA 131* 134*  K 3.2* 3.3*  CL 102 103  CO2 16*  --   GLUCOSE 318* 344*  BUN 14 16  CREATININE 1.35* 1.20  CALCIUM 8.1*  --   GFRNONAA 54*  --   ANIONGAP 13  --     No results for input(s): "PROT", "ALBUMIN", "AST", "ALT", "ALKPHOS", "BILITOT" in the last 168 hours. Hematology Recent Labs  Lab 02/25/2023 1540 02/26/2023 1554  WBC 11.7*  --   RBC 5.63  --   HGB 15.2 18.0*  HCT 52.1* 53.0*  MCV 92.5  --   MCH 27.0  --   MCHC 29.2*  --   RDW 15.7*  --   PLT 124*  --    Cardiac EnzymesNo results for input(s): "TROPONINI" in the last 168 hours. No results for input(s): "TROPIPOC" in the  last 168 hours.  BNPNo results for input(s): "BNP", "PROBNP" in the last 168 hours.  DDimer No results for input(s): "DDIMER" in the last 168 hours.  Radiology/Studies:  DG Chest Port 1 View  Result Date: 03/03/2023 CLINICAL DATA:  [id number] Chest pain in adult [id number] EXAM: PORTABLE CHEST 1 VIEW COMPARISON:  Radiograph 08/16/2014 FINDINGS: Endotracheal tube overlies the midthoracic trachea approximately 4.0 cm above the carina. The fibrillator pad overlies the left chest. Unchanged mildly enlarged cardiac silhouette. There are diffuse interstitial opacities and right lung predominant airspace opacities. No large effusion or evidence of pneumothorax. Thoracic spondylosis. No definite acute osseous abnormality on single frontal view. IMPRESSION: Endotracheal tube overlies the trachea approximately 4.0 cm above the carina. Diffuse interstitial opacities with right lung predominant airspace disease, favoring pulmonary edema, infection is possible in the appropriate clinical setting. Electronically Signed   By: Maurine Simmering M.D.   On: 02/27/2023 16:25    Assessment and Plan:   Patient is a 77 year old M known to have asthma, OSA on CPAP presented to the ER with out of hospital cardiac arrest.  # Out-of-hospital cardiac arrest # Cardiogenic shock -Bedside echo performed by ER reported severely depressed LVEF and Chest x-ray showed bilateral pulmonary congestion. On epinephrine drip with BP 50/30 mmHg, start Levophed and milrinone drips. ER team placing central line during my examination, need to transduce central line port to CVP before giving IV Lasix and provided MAP is more than 55. Patient will need higher level of care, accepted to critical care team at [facility].  Obtain BNP, troponins, lipid panel, HbA1c and 2D echocardiogram.  Cardiology consult upon arrival to [facility]. -Follow ACLS for VT/pulseless VT/Vfib/PEA  # A-fib with RVR (HR 100-110s) likely secondary to pulmonary edema -Likely  secondary to decompensation, treat as above.  I have spent a total of 60 minutes with patient reviewing chart , telemetry, EKGs, labs and examining patient as well as establishing an assessment and plan that was discussed with the patient.  > 50% of time was spent in direct patient care.      For questions or updates, please contact [place] Please consult [email or web address] for contact info under Cardiology/STEMI.   Signed, Vangie Bicker, MD 02/27/2023 5:11 PM

## 2023-02-19 NOTE — H&P (Signed)
NAME:  Jon Whitney, MRN:  [id number], DOB:  08/24/1946, LOS: 0 ADMISSION DATE:  02/07/2023, CONSULTATION DATE:  03/07/2023 REFERRING MD:  Sabra Heck - [facility] EM, CHIEF COMPLAINT:  OOH cardiac arrest    History of Present Illness:  77 yo M PMH OSA on CPAP, HTN, PTSD, OA / associated chronic pain who presented to [facility] via EMS 3/15 with CPR in progress.  EMS was called by pt wife for SOB/dizziness/ collapse. Was agonal with VT on EMS arrival, prior to [facility] he went into PEA. Sounds like had ROSC on and off but all together would est arrest>15 min based on chart review until sustained ROSC.  Intubated in ED, CVC placed, started on Epi. POCUS reportedly low LVEF. Rhythm maintaining as Afib RVR. Cards consulted and rec adding milrinone and NE.  EM did discuss code status w pt, is full code   PCCM was called for admission to  [facility]    In chart review, sounds like pt has had similar episodes (SOB diaphoretic dizzy +/- collapse) intermittently x19mo for several min but with spontaneous resolution. Didn't seek care/eval for this.   Pertinent  Medical History  OSA PTSD OA Anxiety HTN  Significant Hospital Events: Including procedures, antibiotic start and stop dates in addition to other pertinent events   3/15 OOH cardiac arrest, possibly shockable rhythm initially. Down time > 15 min. Afib rvr, decr LVEF. Cards consult. Epi NE milrinone. Admit to [place]   Interim History / Subjective:  Arrives to [facility] ICU   Objective   Blood pressure 98/61, pulse 94, resp. rate (!) 0, height 5\' 10"  (1.778 m), weight (!) 145.2 kg, SpO2 95 %.    Vent Mode: PRVC FiO2 (%):  [100 %] 100 % Set Rate:  [24 bmp] 24 bmp Vt Set:  [500 mL] 500 mL PEEP:  [12 cmH20] 12 cmH20 Plateau Pressure:  [24 cmH20] 24 cmH20   Intake/Output Summary (Last 24 hours) at 03/03/2023 1813 Last data filed at 03/03/2023 1751 Gross per 24 hour  Intake 97.64 ml  Output --  Net 97.64 ml   Filed Weights   02/21/2023 1600 02/13/2023 1645  Weight: (!)  145.2 kg (!) 145.2 kg    Examination: General: critically ill obese older adult M intubated not sedated  HENT: [place]. Pink mm. Dried blood nares. ETT secure  Lungs: Mechanically ventilated, some crackles   Cardiovascular: afib rvr some PVCs  Abdomen: obese soft ndnt  Extremities: no acute joint deformity no cyanosis or clubbing  Neuro: Pupils are fixed and dilated. 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 Incr ceiling on NE. Dc milrinone w fq pvs. Add vaso -add amio   -Ca Kcl replaced at [facility] -2 amps Bicarb now  -consult cards  -avoid fevers, will place order set  for normothermia    Best Practice (right click and "Reselect all SmartList Selections" daily)   Diet/type: NPO DVT prophylaxis: SCD GI prophylaxis: PPI Lines: Central line Foley:  Yes, and it is still needed Code Status:  full code Last date of multidisciplinary goals of care discussion [--]  Labs   CBC: Recent Labs  Lab 02/07/2023 1540 02/12/2023 1554  WBC 11.7*  --   HGB 15.2 18.0*  HCT 52.1* 53.0*  MCV 92.5  --   PLT 124*  --     Basic Metabolic Panel: Recent Labs  Lab 02/23/2023 1540 02/18/2023 1554  NA 131* 134*  K 3.2* 3.3*  CL 102 103  CO2 16*  --   GLUCOSE 318* 344*  BUN 14 16  CREATININE 1.35* 1.20  CALCIUM 8.1*  --     GFR: Estimated Creatinine Clearance: 74.3 mL/min (by C-G formula based on SCr of 1.2 mg/dL). Recent Labs  Lab 03/01/2023 1540  WBC 11.7*    Liver Function Tests: No results for input(s): "AST", "ALT", "ALKPHOS", "BILITOT", "PROT", "ALBUMIN" in the last 168 hours. No results for input(s): "LIPASE", "AMYLASE" in the last 168 hours. No results for input(s): "AMMONIA" in the last 168 hours.  ABG    Component Value Date/Time   TCO2 18 (L) 02/17/2023 1554     Coagulation Profile: No results for input(s): "INR", "PROTIME" in the last 168 hours.  Cardiac Enzymes: No results for input(s): "CKTOTAL", "CKMB", "CKMBINDEX", "TROPONINI" in the last 168 hours.  HbA1C: No results found for: "HGBA1C"  CBG: Recent Labs  Lab 02/14/2023 1526 03/03/2023 1548  GLUCAP 72 172*    Review of Systems:   Unable to obtain intubated   Past Medical History:  He,  has a past medical history of Anxiety, Arthritis, Asthma, Cancer ([place]), Difficult intubation, GERD (gastroesophageal reflux disease), H/O hiatal hernia, Heart murmur, Hypertension, Psychiatric disorder, PTSD (post-traumatic stress disorder), and Sleep apnea.   Surgical History:   Past Surgical History:  Procedure Laterality Date   COLONOSCOPY     FOOT SURGERY Right    HAND SURGERY Right    finger amputations   MOUTH SURGERY     prosthetic soft palate     Social History:   reports that he quit smoking about 55 years ago. His smoking use included cigarettes. He has never used smokeless tobacco. He reports current alcohol use. He reports that he does not use drugs.   Family History:  His family history includes Asthma in his mother; Bone cancer in his father; Pneumonia in his mother.   Allergies Allergies  Allergen Reactions   Penicillins Hives and Rash   Aripiprazole      Home Medications  Prior to Admission medications   Medication Sig Start Date End Date Taking? Authorizing Provider  acetaminophen (TYLENOL) 325 MG tablet   10/12/18   [provider]  albuterol (PROVENTIL HFA;VENTOLIN HFA) 108 (90 BASE) MCG/ACT inhaler Inhale 1 puff into the lungs every 6 (six) hours as needed for wheezing or shortness of breath.    [provider]  ALPRAZolam Duanne Moron) 0.25 MG tablet  10/13/18   [provider]  ALPRAZolam Duanne Moron) 0.5 MG tablet Take 0.5 mg by mouth 3 (three) times daily.    [provider]  amLODipine (NORVASC) 10 MG tablet Take 10 mg by mouth daily.    [provider]  carvedilol (COREG) 3.125 MG tablet  03/17/19   [provider]  cetirizine (ZYRTEC) 10 MG tablet Take 10 mg by mouth  daily.    [provider]  chlorthalidone (HYGROTON) 25 MG tablet  01/28/19   [provider]  diclofenac (VOLTAREN) 75 MG EC tablet Take 1 tablet (75 mg total) by mouth 2 (two) times daily with a meal. 01/26/18   Sanjuana Kava, MD  docusate sodium (COLACE) 100 MG capsule Take 200 mg by mouth daily.    [provider]  fluticasone (FLONASE) 50 MCG/ACT nasal spray Place 2 sprays into the nose daily.    [provider]  hydrOXYzine (ATARAX/VISTARIL) 25 MG tablet Take 50 mg by mouth 3 (three) times daily.    [provider]  loratadine (CLARITIN) 10 MG tablet  02/15/19   [provider]  Menthol-Methyl Salicylate ([place]) 1-15 % CREA  10/12/18   [provider]  methocarbamol (ROBAXIN) 500 MG tablet Take 500 mg by mouth 3 (three) times daily as needed for muscle spasms.    [provider]  Multiple Vitamin (MULTIVITAMIN WITH MINERALS) TABS tablet Take 1 tablet by mouth daily.    [provider]  omeprazole (PRILOSEC) 20 MG capsule Take 20 mg by mouth 3 (three) times daily before meals.    [provider]  ondansetron (ZOFRAN ODT) 8 MG disintegrating tablet Take 1 tablet (8 mg total) by mouth every 8 (eight) hours as needed for nausea or vomiting. 08/16/14   Jola Schmidt, MD  pravastatin (PRAVACHOL)  40 MG tablet Take 20 mg by mouth at bedtime.    [provider]  sildenafil (VIAGRA) 100 MG tablet  08/01/18   [provider]  traMADol (ULTRAM) 50 MG tablet TAKE 1 TABLET BY MOUTH EVERY 6 HOURS AS NEEDED FOR PAIN 08/25/22   Sanjuana Kava, MD     Critical care time: 45 min     CRITICAL CARE Performed by: Cristal Generous   Total critical care time: 45 minutes  Critical care time was exclusive of separately billable procedures and treating other patients. Critical care was necessary to treat or prevent imminent or life-threatening deterioration.  Critical care was time spent personally by me on the following activities: development of treatment plan with patient and/or surrogate as well as nursing, discussions with consultants, evaluation of patient's response to treatment, examination of patient, obtaining history from patient or surrogate, ordering and performing treatments and interventions, ordering and review of laboratory studies, ordering and review of radiographic studies, pulse oximetry and re-evaluation of patient's condition.  Eliseo Gum MSN, AGACNP-BC [place] for pager  02/09/2023, 6:46 PM

## 2023-02-19 NOTE — Progress Notes (Signed)
EEG Tech received no response from the assigned RN. EEG placement will more than likely wait until day shift.

## 2023-02-19 NOTE — Progress Notes (Signed)
[facility] Progress Note Patient Name: Jon Whitney DOB: 06/05/1946 MRN: [id number]   Date of Service  02/18/2023  HPI/Events of Note  Patient transferred from [place] where he was seen for out of hospital PEA cardiac arrest that was preceded by a wide complex tachycardia, acute respiratory failure requiring intubation and mechanical ventilation, coma, and cardiogenic shock, post resuscitation he also had atrial fibrillation with RVR that required starting Amiodarone gtt.  eICU Interventions  New Patient Evaluation.        [name] [name] 02/09/2023, 8:10 PM

## 2023-02-19 NOTE — Progress Notes (Signed)
EEG complete - results pending 

## 2023-02-19 NOTE — IPAL (Signed)
  Interdisciplinary Goals of Care Family Meeting   Date carried out:: 03/03/2023  Location of the meeting: Bedside  Member's involved: Physician, Nurse Practitioner, Bedside Registered Nurse, and Family Member or next of kin  Durable Power of Attorney or acting medical decision maker: Patient's wife [name]  Discussion: We discussed goals of care for Jon Whitney .  Explained that the patient's current therapies and prognosis, risk of endorgan injury, persistent hemodynamic instability to patient's wife, son, daughter-in-law at bedside.  I have indicated that we intend to continue all aggressive care to achieve stability to allow assessment for endorgan injuries especially neurological injury.  I did mention that he has shown signs on his neurological exam, particularly his eye exam, for hypoxic injury.  A head CT will be done when he is stable to travel.  I approach the subject with them of possibly deferring chest compressions/CPR if he were to devolved pulselessness given the low likelihood that he would recover a blood pressure and given the evidence that we already have of neurological injury.  They are going to consider this but for now believe that the patient's wishes would reflect full CODE STATUS and all interventions if he were to decline.  Code status: Full Code  Disposition: Continue current acute care   Time spent for the meeting: 20 minutes  Collene Gobble 02/09/2023, 8:17 PM

## 2023-02-19 NOTE — Progress Notes (Signed)
Received protocol consult for US guided IV due to vasopressor order. Pt currently has a central line. Consult cleared.

## 2023-02-19 NOTE — Procedures (Signed)
Arterial Line Insertion Start/End03/05/2023 8:25 PM, 02/19/2023 8:32 PM  Patient location: ICU. Preanesthetic checklist: patient identified, IV checked, site marked, risks and benefits discussed, surgical consent, monitors and equipment checked, pre-op evaluation and timeout performed Right, radial was placed Catheter size: 20 G Hand hygiene performed  and maximum sterile barriers used  Allen's test indicative of satisfactory collateral circulation Attempts: 1 Procedure performed without using ultrasound guided technique. Following insertion, dressing applied and Biopatch. Post procedure assessment: normal and unchanged  Patient tolerated the procedure well with no immediate complications.

## 2023-02-19 NOTE — Progress Notes (Signed)
Patient arrived from [facility] via care link. RT placed patient on previous vent settings per [facility] RT.

## 2023-02-19 NOTE — ED Provider Notes (Addendum)
[facility] Provider Note   CSN: [id number] Arrival date & time: 02/08/2023  1519     History  Chief Complaint  Patient presents with   Cardiac Arrest    Jon Whitney is a 77 y.o. male.  HPI   This patient is a 77 year old male, history of hypertension on amlodipine, PTSD on Xanax, he also has a history of high cholesterol.  No known cardiac history that we know of.  He presents by EMS transport after reportedly becoming dizzy at home, feeling very sweaty, he collapsed to the ground witnessed by wife.  He was face down when the paramedics found him with agonal respirations and a ventricular tachycardia.  Before he could be defibrillated he changed his rhythm into a PEA.  He received 1 dose of epi prehospital, the patient had CPR started prehospital, he was bagged with a bag-valve-mask.  No further information available at the time of arrival  Home Medications Prior to Admission medications   Medication Sig Start Date End Date Taking? Authorizing Provider  acetaminophen (TYLENOL) 325 MG tablet  10/12/18   [provider]  albuterol (PROVENTIL HFA;VENTOLIN HFA) 108 (90 BASE) MCG/ACT inhaler Inhale 1 puff into the lungs every 6 (six) hours as needed for wheezing or shortness of breath.    [provider]  ALPRAZolam Duanne Moron) 0.25 MG tablet  10/13/18   [provider]  ALPRAZolam Duanne Moron) 0.5 MG tablet Take 0.5 mg by mouth 3 (three) times daily.    [provider]  amLODipine (NORVASC) 10 MG tablet Take 10 mg by mouth daily.    [provider]  carvedilol (COREG) 3.125 MG tablet  03/17/19   [provider]  cetirizine (ZYRTEC) 10 MG tablet Take 10 mg by mouth daily.    [provider]  chlorthalidone (HYGROTON) 25 MG tablet  01/28/19   [provider]  diclofenac (VOLTAREN) 75 MG EC tablet Take 1 tablet (75 mg total) by mouth 2 (two) times daily with a meal. 01/26/18    Sanjuana Kava, MD  docusate sodium (COLACE) 100 MG capsule Take 200 mg by mouth daily.    [provider]  fluticasone (FLONASE) 50 MCG/ACT nasal spray Place 2 sprays into the nose daily.    [provider]  hydrOXYzine (ATARAX/VISTARIL) 25 MG tablet Take 50 mg by mouth 3 (three) times daily.    [provider]  loratadine (CLARITIN) 10 MG tablet  02/15/19   [provider]  Menthol-Methyl Salicylate ([place]) 1-15 % CREA  10/12/18   [provider]  methocarbamol (ROBAXIN) 500 MG tablet Take 500 mg by mouth 3 (three) times daily as needed for muscle spasms.    [provider]  Multiple Vitamin (MULTIVITAMIN WITH MINERALS) TABS tablet Take 1 tablet by mouth daily.    [provider]  omeprazole (PRILOSEC) 20 MG capsule Take 20 mg by mouth 3 (three) times daily before meals.    [provider]  ondansetron (ZOFRAN ODT) 8 MG disintegrating tablet Take 1 tablet (8 mg total) by mouth every 8 (eight) hours as needed for nausea or vomiting. 08/16/14   Jola Schmidt, MD  pravastatin (PRAVACHOL) 40 MG tablet Take 20 mg by mouth at bedtime.    [provider]  sildenafil (VIAGRA) 100 MG tablet  08/01/18   [provider]  traMADol (ULTRAM) 50 MG tablet TAKE 1 TABLET BY MOUTH EVERY 6 HOURS AS NEEDED FOR PAIN 08/25/22   Sanjuana Kava, MD  Allergies    Penicillins and Aripiprazole    Review of Systems   Review of Systems  Unable to perform ROS: Acuity of condition  All other systems reviewed and are negative.   Physical Exam Updated Vital Signs BP (!) 83/53   Pulse (!) 111   Resp (!) 8   Ht 1.778 m (5\' 10" )   Wt (!) 145.2 kg   SpO2 98%   BMI 45.92 kg/m  Physical Exam Vitals and nursing note reviewed.  Constitutional:      General: He is in acute distress.     Appearance: He is well-developed.     Comments: Obtunded, not responsive, GCS 3  HENT:     Head: Normocephalic and atraumatic.     Nose:      Comments: Blood from the nose    Mouth/Throat:     Mouth: Mucous membranes are moist.     Pharynx: No oropharyngeal exudate.  Eyes:     General: No scleral icterus.       Right eye: No discharge.        Left eye: No discharge.     Conjunctiva/sclera: Conjunctivae normal.     Pupils: Pupils are equal, round, and reactive to light.  Neck:     Thyroid: No thyromegaly.     Vascular: No JVD.  Cardiovascular:     Heart sounds: Normal heart sounds. No murmur heard.    No friction rub. No gallop.     Comments: Pulseless electrical activity rate of about 20 Pulmonary:     Effort: Respiratory distress present.     Comments: Minimal breath sounds with bag-valve-mask Abdominal:     General: Bowel sounds are normal. There is no distension.     Palpations: Abdomen is soft. There is no mass.     Tenderness: There is no abdominal tenderness.  Musculoskeletal:        General: No tenderness. Normal range of motion.     Cervical back: Normal range of motion and neck supple.     Comments: Knee brace to the left knee, no edema or asymmetry  Lymphadenopathy:     Cervical: No cervical adenopathy.  Skin:    General: Skin is warm and dry.     Findings: No erythema or rash.  Neurological:     Comments: GCS 3     ED Results / Procedures / Treatments   Labs (all labs ordered are listed, but only abnormal results are displayed) Labs Reviewed  BASIC METABOLIC PANEL - Abnormal; Notable for the following components:      Result Value   Sodium 131 (*)    Potassium 3.2 (*)    CO2 16 (*)    Glucose, Bld 318 (*)    Creatinine, Ser 1.35 (*)    Calcium 8.1 (*)    GFR, Estimated 54 (*)    All other components within normal limits  CBC - Abnormal; Notable for the following components:   WBC 11.7 (*)    HCT 52.1 (*)    MCHC 29.2 (*)    RDW 15.7 (*)    Platelets 124 (*)    All other components within normal limits  CBG MONITORING, ED - Abnormal; Notable for the following components:    Glucose-Capillary 172 (*)    All other components within normal limits  I-STAT CHEM 8, ED - Abnormal; Notable for the following components:   Sodium 134 (*)    Potassium 3.3 (*)    Glucose, Bld 344 (*)  Calcium, Ion 0.99 (*)    TCO2 18 (*)    Hemoglobin 18.0 (*)    HCT 53.0 (*)    All other components within normal limits  TROPONIN I (HIGH SENSITIVITY) - Abnormal; Notable for the following components:   Troponin I (High Sensitivity) 19 (*)    All other components within normal limits  COOXEMETRY PANEL  BRAIN NATRIURETIC PEPTIDE  MAGNESIUM  COOXEMETRY PANEL  CBG MONITORING, ED  TROPONIN I (HIGH SENSITIVITY)    EKG None  Radiology DG Chest Port 1 View  Result Date: 03/02/2023 CLINICAL DATA:  [id number] Chest pain in adult [id number] EXAM: PORTABLE CHEST 1 VIEW COMPARISON:  Radiograph 08/16/2014 FINDINGS: Endotracheal tube overlies the midthoracic trachea approximately 4.0 cm above the carina. The fibrillator pad overlies the left chest. Unchanged mildly enlarged cardiac silhouette. There are diffuse interstitial opacities and right lung predominant airspace opacities. No large effusion or evidence of pneumothorax. Thoracic spondylosis. No definite acute osseous abnormality on single frontal view. IMPRESSION: Endotracheal tube overlies the trachea approximately 4.0 cm above the carina. Diffuse interstitial opacities with right lung predominant airspace disease, favoring pulmonary edema, infection is possible in the appropriate clinical setting. Electronically Signed   By: Maurine Simmering M.D.   On: 02/18/2023 16:25    Procedures Procedure Name: Intubation Date/Time: 03/01/2023 3:53 PM  Performed by: Noemi Chapel, MDPre-anesthesia Checklist: Patient identified and Patient being monitored Oxygen Delivery Method: Ambu bag Preoxygenation: Pre-oxygenation with 100% oxygen Induction Type: Cricoid Pressure applied Laryngoscope Size: Mac and 4 Grade View: Grade IV Tube size: 7.5 mm Number of  attempts: 1 Airway Equipment and Method: Stylet Placement Confirmation: ETT inserted through vocal cords under direct vision, CO2 detector and Breath sounds checked- equal and bilateral Secured at: 25 cm Tube secured with: ETT holder Dental Injury: Teeth and Oropharynx as per pre-operative assessment  Difficulty Due To: Difficulty was anticipated, Difficult Airway- due to large tongue and Difficult Airway- due to reduced neck mobility Comments:      CPR  Date/Time: 02/16/2023 3:54 PM  Performed by: Noemi Chapel, MD Authorized by: Noemi Chapel, MD  CPR Procedure Details:    ACLS/BLS initiated by EMS: Yes     CPR/ACLS performed in the ED: Yes     Duration of CPR (minutes):  15   Outcome: ROSC obtained    CPR performed via ACLS guidelines under my direct supervision.  See RN documentation for details including defibrillator use, medications, doses and timing. Comments:     Patient required multiple rounds of CPR, intermittent pulses between .Critical Care  Performed by: Noemi Chapel, MD Authorized by: Noemi Chapel, MD   Critical care provider statement:    Critical care time (minutes):  35   Critical care time was exclusive of:  Separately billable procedures and treating other patients   Critical care was necessary to treat or prevent imminent or life-threatening deterioration of the following conditions:  Cardiac failure and circulatory failure   Critical care was time spent personally by me on the following activities:  Development of treatment plan with patient or surrogate, discussions with consultants, evaluation of patient's response to treatment, examination of patient, obtaining history from patient or surrogate, review of old charts, re-evaluation of patient's condition, pulse oximetry, ordering and review of radiographic studies, ordering and review of laboratory studies and ordering and performing treatments and interventions   I assumed direction of critical care for  this patient from another provider in my specialty: no     Care discussed with: admitting provider  Comments:       .Central Line  Date/Time: 02/15/2023 5:27 PM  Performed by: Noemi Chapel, MD Authorized by: Noemi Chapel, MD   Consent:    Consent obtained:  Emergent situation Universal protocol:    Procedure explained and questions answered to patient or proxy's satisfaction: yes     Relevant documents present and verified: yes     Test results available: yes     Imaging studies available: yes     Required blood products, implants, devices, and special equipment available: yes     Site/side marked: yes     Immediately prior to procedure, a time out was called: yes     Patient identity confirmed:  Arm band Pre-procedure details:    Indication(s): central venous access and insufficient peripheral access     Hand hygiene: Hand hygiene performed prior to insertion     Sterile barrier technique: All elements of maximal sterile technique followed     Skin preparation:  Chlorhexidine   Skin preparation agent: Skin preparation agent completely dried prior to procedure   Sedation:    Sedation type:  None Anesthesia:    Anesthesia method:  None Procedure details:    Location:  R internal jugular   Patient position:  Trendelenburg   Procedural supplies:  Triple lumen   Catheter size:  7 Fr   Landmarks identified: yes     Ultrasound guidance: yes     Ultrasound guidance timing: real time     Sterile ultrasound techniques: Sterile gel and sterile probe covers were used     Number of attempts:  1   Successful placement: yes   Post-procedure details:    Post-procedure:  Dressing applied and line sutured   Assessment:  Blood return through all ports, free fluid flow, no pneumothorax on x-ray and placement verified by x-ray Comments:           Medications Ordered in ED Medications  EPINEPHrine (ADRENALIN) 1 MG/10ML injection (  Not Given 02/12/2023 1644)  EPINEPHrine (ADRENALIN)  5 mg in NS 250 mL (0.02 mg/mL) premix infusion (5.5 mcg/min Intravenous Infusion Verify 02/15/2023 1641)  milrinone (PRIMACOR) 20 MG/100 ML (0.2 mg/mL) infusion (0.375 mcg/kg/min  145.2 kg Intravenous Infusion Verify 02/11/2023 1640)  norepinephrine (LEVOPHED) 4mg  in 251mL (0.016 mg/mL) premix infusion (5 mcg/min Intravenous Infusion Verify 03/06/2023 1640)  magnesium sulfate IVPB 2 g 50 mL (2 g Intravenous Not Given 03/04/2023 1703)  potassium chloride 10 mEq in 100 mL IVPB (has no administration in time range)  potassium chloride (KLOR-CON) packet 20 mEq (has no administration in time range)  magnesium sulfate IVPB 2 g 50 mL (has no administration in time range)  potassium chloride 10 mEq in 100 mL IVPB (has no administration in time range)  calcium gluconate 1 g/ 50 mL sodium chloride IVPB (has no administration in time range)    ED Course/ Medical Decision Making/ A&P                             Medical Decision Making Amount and/or Complexity of Data Reviewed Labs: ordered. Radiology: ordered.  Risk Prescription drug management. Decision regarding hospitalization.   This patient is critically ill, family has arrived and adds additional history that this patient had been having intermittent episodes of near syncope over the last 6 months lasting several minutes and then resolving.  None of these were investigated medically, the patient did not come to the hospital.  Each  time he would become diaphoretic and short of breath which is exactly what happened today.  The patient collapsed today and was found to be in ventricular tachycardia by the paramedics.  The rhythm changed to PEA, he was given 1 dose of epinephrine and transported to the scene, CPR was initiated 15 minutes prior to arrival.  On my exam the patient continues to be in PEA, after 4 doses of epinephrine and continuous CPR under my direction the patient regained pulses.  He then lost them again transiently but came back with another  dose of epinephrine.  Epi drip was requested from pharmacy.  EKG reveals the patient is in atrial fibrillation, no prior EKG since 2015, different EKG today with abnormal ST segments, no elevation.  The patient was placed on the cardiac monitor, endotracheal tube was inserted and secured, oxygenation remained low between 70 and 80% despite 100% oxygenation.  Family was brought to the bedside and request that he is a full resuscitation.  He is still continuing to have intermittent CPR.  Labs actually unremarkable except for slight low potassium.  I discussed the case with cardiology who has made recommendations to start milrinone and Levophed in addition to epinephrine, discussed with critical care Dr. Tacy Learn who has accepted the patient in transfer.  Getting magnesium and potassium as well        Final Clinical Impression(s) / ED Diagnoses Final diagnoses:  Cardiac arrest [facility])  Ventricular arrhythmia  Hypokalemia  Cardiogenic shock [facility])    Rx / DC Orders ED Discharge Orders     None         Noemi Chapel, MD 02/11/2023 1651    Noemi Chapel, MD 03/06/2023 1728

## 2023-02-19 NOTE — Progress Notes (Signed)
[facility] Progress Note Patient Name: ASAAD WOHLER DOB: 01-14-1946 MRN: [id number]   Date of Service  02/11/2023  HPI/Events of Note  Serum lactic acid at 6:40 pm on arrival from [facility] was 4.1 (patient is s/p Code Blue with ROSC).  eICU Interventions  Trend serum lactic acid level.        Frederik Pear 02/26/2023, 9:19 PM

## 2023-02-19 NOTE — Progress Notes (Incomplete)
Attending note: I have seen and examined the patient. History, labs and imaging reviewed.  77 year old with history of OSA on CPAP, hypertension, PTSD, asthma.  Has syncopal episodes for the past 6 months of unclear etiology.  Presenting with syncope to the ED in cardiac arrest initially repeat and then PEA.  15 minutes to ROSC.  EKG shows A-fib with RVR.  Started on pressors, milrinone for depressed EF noted in the ED and transferred to [facility]  Blood pressure 98/61, pulse (!) 135, resp. rate (!) 24, height 5\' 10"  (1.778 m), weight (!) 145.2 kg, SpO2 95 %. Gen:      No acute distress HEENT:  EOMI, sclera anicteric Neck:     No masses; no thyromegaly, ET tube Lungs:    Clear to auscultation bilaterally; normal respiratory effort CV:         Regular rate and rhythm; no murmurs Abd:      + bowel sounds; soft, non-tender; no palpable masses, no distension Ext:    No edema; adequate peripheral perfusion Skin:      Warm and dry; no rash Neuro: Sedated, unresponsive  Labs/Imaging personally reviewed, significant for Sodium 131, potassium 3.3, BUN/creatinine 40/1.35 BNP 390  Assessment/plan: Cardiac arrest Unclear etiology.  Reportedly had ventricular tachycardias and initially recovering and severely depressed EF at bedside echo in the ED Milrinone stopped dialysis every rounds of repeat Start amiodarone Continue epinephrine, nor epi.  Add vasopressin CT head and CT angiogram to rule out PE Get formal echocardiogram.  Trend labs, EKG.  The patient is critically ill with multiple organ systems failure and requires high complexity decision making for assessment and support, frequent evaluation and titration of therapies, application of advanced monitoring technologies and extensive interpretation of multiple databases.  Critical care time - 35 mins. This represents my time independent of the NPs time taking care of the pt.  Marshell Garfinkel MD [facility] 02/05/2023, 6:43  PM

## 2023-02-19 NOTE — Progress Notes (Signed)
[facility] Progress Note Patient Name: Jon Whitney DOB: Apr 09, 1946 MRN: [id number]   Date of Service  03/02/2023  HPI/Events of Note  ABG reviewed. PH 7.22.  eICU Interventions  Respiratory rate increased to 30.        [name] Tameya Kuznia 02/13/2023, 11:17 PM

## 2023-02-20 ENCOUNTER — Inpatient Hospital Stay (HOSPITAL_COMMUNITY): Payer: Medicare Other

## 2023-02-20 DIAGNOSIS — R4182 Altered mental status, unspecified: Secondary | ICD-10-CM | POA: Diagnosis not present

## 2023-02-20 DIAGNOSIS — I469 Cardiac arrest, cause unspecified: Secondary | ICD-10-CM

## 2023-02-20 LAB — POCT I-STAT 7, (LYTES, BLD GAS, ICA,H+H)
Acid-base deficit: 2 mmol/L (ref 0.0–2.0)
Acid-base deficit: 3 mmol/L — ABNORMAL HIGH (ref 0.0–2.0)
Acid-base deficit: 5 mmol/L — ABNORMAL HIGH (ref 0.0–2.0)
Acid-base deficit: 6 mmol/L — ABNORMAL HIGH (ref 0.0–2.0)
Acid-base deficit: 6 mmol/L — ABNORMAL HIGH (ref 0.0–2.0)
Bicarbonate: 19.6 mmol/L — ABNORMAL LOW (ref 20.0–28.0)
Bicarbonate: 20.6 mmol/L (ref 20.0–28.0)
Bicarbonate: 21.3 mmol/L (ref 20.0–28.0)
Bicarbonate: 22.5 mmol/L (ref 20.0–28.0)
Bicarbonate: 25.2 mmol/L (ref 20.0–28.0)
Calcium, Ion: 1.08 mmol/L — ABNORMAL LOW (ref 1.15–1.40)
Calcium, Ion: 1.09 mmol/L — ABNORMAL LOW (ref 1.15–1.40)
Calcium, Ion: 1.11 mmol/L — ABNORMAL LOW (ref 1.15–1.40)
Calcium, Ion: 1.13 mmol/L — ABNORMAL LOW (ref 1.15–1.40)
Calcium, Ion: 1.14 mmol/L — ABNORMAL LOW (ref 1.15–1.40)
HCT: 45 % (ref 39.0–52.0)
HCT: 48 % (ref 39.0–52.0)
HCT: 48 % (ref 39.0–52.0)
HCT: 49 % (ref 39.0–52.0)
HCT: 50 % (ref 39.0–52.0)
Hemoglobin: 15.3 g/dL (ref 13.0–17.0)
Hemoglobin: 16.3 g/dL (ref 13.0–17.0)
Hemoglobin: 16.3 g/dL (ref 13.0–17.0)
Hemoglobin: 16.7 g/dL (ref 13.0–17.0)
Hemoglobin: 17 g/dL (ref 13.0–17.0)
O2 Saturation: 100 %
O2 Saturation: 100 %
O2 Saturation: 100 %
O2 Saturation: 96 %
O2 Saturation: 99 %
Patient temperature: 96.6
Patient temperature: 97.5
Patient temperature: 97.7
Patient temperature: 97.9
Patient temperature: 98.1
Potassium: 3.2 mmol/L — ABNORMAL LOW (ref 3.5–5.1)
Potassium: 3.4 mmol/L — ABNORMAL LOW (ref 3.5–5.1)
Potassium: 4 mmol/L (ref 3.5–5.1)
Potassium: 4.1 mmol/L (ref 3.5–5.1)
Potassium: 4.4 mmol/L (ref 3.5–5.1)
Sodium: 132 mmol/L — ABNORMAL LOW (ref 135–145)
Sodium: 133 mmol/L — ABNORMAL LOW (ref 135–145)
Sodium: 134 mmol/L — ABNORMAL LOW (ref 135–145)
Sodium: 134 mmol/L — ABNORMAL LOW (ref 135–145)
Sodium: 134 mmol/L — ABNORMAL LOW (ref 135–145)
TCO2: 21 mmol/L — ABNORMAL LOW (ref 22–32)
TCO2: 22 mmol/L (ref 22–32)
TCO2: 23 mmol/L (ref 22–32)
TCO2: 24 mmol/L (ref 22–32)
TCO2: 27 mmol/L (ref 22–32)
pCO2 arterial: 35.5 mmHg (ref 32–48)
pCO2 arterial: 36.8 mmHg (ref 32–48)
pCO2 arterial: 38.2 mmHg (ref 32–48)
pCO2 arterial: 45.1 mmHg (ref 32–48)
pCO2 arterial: 56.6 mmHg — ABNORMAL HIGH (ref 32–48)
pH, Arterial: 7.255 — ABNORMAL LOW (ref 7.35–7.45)
pH, Arterial: 7.279 — ABNORMAL LOW (ref 7.35–7.45)
pH, Arterial: 7.316 — ABNORMAL LOW (ref 7.35–7.45)
pH, Arterial: 7.35 (ref 7.35–7.45)
pH, Arterial: 7.407 (ref 7.35–7.45)
pO2, Arterial: 170 mmHg — ABNORMAL HIGH (ref 83–108)
pO2, Arterial: 200 mmHg — ABNORMAL HIGH (ref 83–108)
pO2, Arterial: 267 mmHg — ABNORMAL HIGH (ref 83–108)
pO2, Arterial: 356 mmHg — ABNORMAL HIGH (ref 83–108)
pO2, Arterial: 92 mmHg (ref 83–108)

## 2023-02-20 LAB — BASIC METABOLIC PANEL
Anion gap: 11 (ref 5–15)
Anion gap: 14 (ref 5–15)
Anion gap: 9 (ref 5–15)
BUN: 21 mg/dL (ref 8–23)
BUN: 23 mg/dL (ref 8–23)
BUN: 27 mg/dL — ABNORMAL HIGH (ref 8–23)
CO2: 19 mmol/L — ABNORMAL LOW (ref 22–32)
CO2: 20 mmol/L — ABNORMAL LOW (ref 22–32)
CO2: 22 mmol/L (ref 22–32)
Calcium: 7.9 mg/dL — ABNORMAL LOW (ref 8.9–10.3)
Calcium: 7.8 mg/dL — ABNORMAL LOW (ref 8.9–10.3)
Calcium: 7.8 mg/dL — ABNORMAL LOW (ref 8.9–10.3)
Chloride: 100 mmol/L (ref 98–111)
Chloride: 99 mmol/L (ref 98–111)
Chloride: 101 mmol/L (ref 98–111)
Creatinine, Ser: 2.58 mg/dL — ABNORMAL HIGH (ref 0.61–1.24)
Creatinine, Ser: 3.06 mg/dL — ABNORMAL HIGH (ref 0.61–1.24)
Creatinine, Ser: 3.48 mg/dL — ABNORMAL HIGH (ref 0.61–1.24)
GFR, Estimated: 25 mL/min — ABNORMAL LOW (ref >60)
GFR, Estimated: 20 mL/min — ABNORMAL LOW (ref >60)
GFR, Estimated: 17 mL/min — ABNORMAL LOW (ref >60)
Glucose, Bld: 298 mg/dL — ABNORMAL HIGH (ref 70–99)
Glucose, Bld: 252 mg/dL — ABNORMAL HIGH (ref 70–99)
Glucose, Bld: 174 mg/dL — ABNORMAL HIGH (ref 70–99)
Potassium: 3.2 mmol/L — ABNORMAL LOW (ref 3.5–5.1)
Potassium: 4.1 mmol/L (ref 3.5–5.1)
Potassium: 4.2 mmol/L (ref 3.5–5.1)
Sodium: 130 mmol/L — ABNORMAL LOW (ref 135–145)
Sodium: 133 mmol/L — ABNORMAL LOW (ref 135–145)
Sodium: 132 mmol/L — ABNORMAL LOW (ref 135–145)

## 2023-02-20 LAB — CBC
HCT: 48.6 % (ref 39.0–52.0)
Hemoglobin: 15 g/dL (ref 13.0–17.0)
MCH: 27 pg (ref 26.0–34.0)
MCHC: 30.9 g/dL (ref 30.0–36.0)
MCV: 87.6 fL (ref 80.0–100.0)
Platelets: 159 10*3/uL (ref 150–400)
RBC: 5.55 MIL/uL (ref 4.22–5.81)
RDW: 15.4 % (ref 11.5–15.5)
WBC: 15.2 10*3/uL — ABNORMAL HIGH (ref 4.0–10.5)
nRBC: 0 % (ref 0.0–0.2)

## 2023-02-20 LAB — ECHOCARDIOGRAM COMPLETE
Area-P 1/2: 2.9 cm2
Height: 70 in
S' Lateral: 3.5 cm
Weight: 5308.68 oz

## 2023-02-20 LAB — COMPREHENSIVE METABOLIC PANEL
ALT: 43 U/L (ref 0–44)
AST: 64 U/L — ABNORMAL HIGH (ref 15–41)
Albumin: 3.2 g/dL — ABNORMAL LOW (ref 3.5–5.0)
Alkaline Phosphatase: 72 U/L (ref 38–126)
Anion gap: 14 (ref 5–15)
BUN: 21 mg/dL (ref 8–23)
CO2: 20 mmol/L — ABNORMAL LOW (ref 22–32)
Calcium: 8 mg/dL — ABNORMAL LOW (ref 8.9–10.3)
Chloride: 99 mmol/L (ref 98–111)
Creatinine, Ser: 2.34 mg/dL — ABNORMAL HIGH (ref 0.61–1.24)
GFR, Estimated: 28 mL/min — ABNORMAL LOW (ref >60)
Glucose, Bld: 315 mg/dL — ABNORMAL HIGH (ref 70–99)
Potassium: 3.4 mmol/L — ABNORMAL LOW (ref 3.5–5.1)
Sodium: 133 mmol/L — ABNORMAL LOW (ref 135–145)
Total Bilirubin: 1 mg/dL (ref 0.3–1.2)
Total Protein: 6.1 g/dL — ABNORMAL LOW (ref 6.5–8.1)

## 2023-02-20 LAB — COOXEMETRY PANEL
Carboxyhemoglobin: 1.9 % — ABNORMAL HIGH (ref 0.5–1.5)
Carboxyhemoglobin: 1.4 % (ref 0.5–1.5)
Methemoglobin: <0.7 % (ref 0.0–1.5)
Methemoglobin: <0.7 % (ref 0.0–1.5)
O2 Saturation: 88.5 %
O2 Saturation: 91.1 %
Total hemoglobin: 13.4 g/dL (ref 12.0–16.0)
Total hemoglobin: 15 g/dL (ref 12.0–16.0)

## 2023-02-20 LAB — GLUCOSE, CAPILLARY
Glucose-Capillary: 297 mg/dL — ABNORMAL HIGH (ref 70–99)
Glucose-Capillary: 288 mg/dL — ABNORMAL HIGH (ref 70–99)
Glucose-Capillary: 183 mg/dL — ABNORMAL HIGH (ref 70–99)

## 2023-02-20 LAB — MAGNESIUM: Magnesium: 1.8 mg/dL (ref 1.7–2.4)

## 2023-02-20 LAB — LACTIC ACID, PLASMA: Lactic Acid, Venous: 4 mmol/L (ref 0.5–1.9)

## 2023-02-20 LAB — BRAIN NATRIURETIC PEPTIDE: B Natriuretic Peptide: 110.5 pg/mL — ABNORMAL HIGH (ref 0.0–100.0)

## 2023-02-20 MED ORDER — ACETAMINOPHEN 325 MG PO TABS: 650.0000 mg | ORAL_TABLET | Freq: Four times a day (QID) | ORAL | Status: DC | PRN

## 2023-02-20 MED ORDER — GLYCOPYRROLATE 0.2 MG/ML IJ SOLN: 0.2000 mg | INTRAMUSCULAR | Status: DC | PRN

## 2023-02-20 MED ORDER — FAMOTIDINE 20 MG PO TABS: 20.0000 mg | ORAL_TABLET | Freq: Every day | ORAL | Status: DC

## 2023-02-20 MED ORDER — GLYCOPYRROLATE 1 MG PO TABS: 1.0000 mg | ORAL_TABLET | ORAL | Status: DC | PRN

## 2023-02-20 MED ORDER — POTASSIUM CHLORIDE 20 MEQ PO PACK
40.0000 meq | PACK | Freq: Once | ORAL | Status: AC
  Administered 2023-02-20: 40 meq
  Filled 2023-02-20: qty 2

## 2023-02-20 MED ORDER — INSULIN DETEMIR 100 UNIT/ML ~~LOC~~ SOLN
20.0000 [IU] | Freq: Two times a day (BID) | SUBCUTANEOUS | Status: DC
  Administered 2023-02-20: 20 [IU] via SUBCUTANEOUS
  Filled 2023-02-20 (×2): qty 0.2

## 2023-02-20 MED ORDER — NOREPINEPHRINE 16 MG/250ML-% IV SOLN
0.0000 ug/min | INTRAVENOUS | Status: DC
  Administered 2023-02-20: 20 ug/min via INTRAVENOUS
  Administered 2023-02-20: 22 ug/min via INTRAVENOUS
  Filled 2023-02-20 (×2): qty 250

## 2023-02-20 MED ORDER — PERFLUTREN LIPID MICROSPHERE
1.0000 mL | INTRAVENOUS | Status: AC | PRN
  Administered 2023-02-20: 3 mL via INTRAVENOUS

## 2023-02-20 MED ORDER — SODIUM CHLORIDE 0.9 % IV SOLN: INTRAVENOUS | Status: DC

## 2023-02-20 MED ORDER — POLYVINYL ALCOHOL 1.4 % OP SOLN: 1.0000 [drp] | Freq: Four times a day (QID) | OPHTHALMIC | Status: DC | PRN

## 2023-02-20 MED ORDER — NOREPINEPHRINE 4 MG/250ML-% IV SOLN
INTRAVENOUS | Status: AC
  Filled 2023-02-20: qty 250

## 2023-02-20 MED ORDER — SODIUM CHLORIDE 0.9 % IV SOLN
2.0000 g | INTRAVENOUS | Status: DC
  Administered 2023-02-20: 2 g via INTRAVENOUS
  Filled 2023-02-20: qty 20

## 2023-02-20 MED ORDER — POTASSIUM CHLORIDE 10 MEQ/50ML IV SOLN
10.0000 meq | INTRAVENOUS | Status: AC
  Administered 2023-02-20 (×2): 10 meq via INTRAVENOUS
  Filled 2023-02-20 (×2): qty 50

## 2023-02-20 MED ORDER — MORPHINE SULFATE (PF) 2 MG/ML IV SOLN: 2.0000 mg | INTRAVENOUS | Status: DC | PRN

## 2023-02-20 MED ORDER — MAGNESIUM SULFATE 2 GM/50ML IV SOLN
2.0000 g | Freq: Once | INTRAVENOUS | Status: AC
  Administered 2023-02-20: 2 g via INTRAVENOUS
  Filled 2023-02-20: qty 50

## 2023-02-20 MED ORDER — IOPAMIDOL (ISOVUE-370) INJECTION 76%
75.0000 mL | Freq: Once | INTRAVENOUS | Status: AC | PRN
  Administered 2023-02-20: 75 mL via INTRAVENOUS

## 2023-02-20 MED ORDER — ORAL CARE MOUTH RINSE
15.0000 mL | OROMUCOSAL | Status: DC
  Administered 2023-02-20 (×5): 15 mL via OROMUCOSAL

## 2023-02-20 MED ORDER — ACETAMINOPHEN 650 MG RE SUPP: 650.0000 mg | Freq: Four times a day (QID) | RECTAL | Status: DC | PRN

## 2023-02-20 MED ORDER — ORAL CARE MOUTH RINSE: 15.0000 mL | OROMUCOSAL | Status: DC | PRN

## 2023-02-20 MED ORDER — EPINEPHRINE 1 MG/10ML IJ SOSY
PREFILLED_SYRINGE | INTRAMUSCULAR | Status: AC
  Filled 2023-02-20: qty 10

## 2023-02-20 MED ORDER — ATROPINE SULFATE 1 MG/10ML IJ SOSY
PREFILLED_SYRINGE | INTRAMUSCULAR | Status: AC
  Filled 2023-02-20: qty 10

## 2023-02-22 LAB — HEMOGLOBIN A1C
Hgb A1c MFr Bld: 6 % — ABNORMAL HIGH (ref 4.8–5.6)
Mean Plasma Glucose: 126 mg/dL

## 2023-03-08 NOTE — Procedures (Signed)
Patient Name: EDRIC BIERCE  MRN: [id number]  Epilepsy Attending: Lora Havens  Referring Physician/Provider: Cristal Generous, NP  Date: 02/25/2023 Duration: 21.56 mins  Patient history: 77yo M s/p cardiac arrest. EEG to evaluate for seizure.   Level of alertness:  comatose  AEDs during EEG study: None  Technical aspects: This EEG study was done with scalp electrodes positioned according to the 10-20 International system of electrode placement. Electrical activity was reviewed with band pass filter of 1-70Hz , sensitivity of 7 uV/mm, display speed of 79mm/sec with a 60Hz  notched filter applied as appropriate. EEG data were recorded continuously and digitally stored.  Video monitoring was available and reviewed as appropriate.  Description: EEG showed continuous generalized background suppression, not reactive to stimuli. Hyperventilation and photic stimulation were not performed.     ABNORMALITY - Background suppression, generalized  IMPRESSION: This study is suggestive of profound diffuse encephalopathy, nonspecific etiology but could be secondary to anoxic/hypoxic brain injury. No seizures or epileptiform discharges were seen throughout the recording.  Derelle Cockrell Barbra Sarks

## 2023-03-08 NOTE — Procedures (Signed)
Adult Brain Death Determination  Time of Examination: 2023/03/19 2:49 PM  No Evidence of /Cause of Reversible CNS Depression  Core temperature must be greater >36 degrees. Last temp: Temp: 97.7 F (36.5 C) (Note: If unable to achieve normothermia after 12 hours of temperature management, may consider proceeding with Brain Death Evaluation.):    yes  Evidence of severe metabolic perturbations that could potentate CNS depression. Consider glucose, Na, creatinine, PaCO2, SaO2.:    Absent  Evidence of drugs, by history or measurement, that could potentiate central nervous system depression: narcotics, ethanol, benzodiazepines, barbiturates, neuromuscular blockade.:     Absent  Absence of Cortical Function  GCS = 3:    yes  Absence of Brain Stem Reflexes and Responses  Pupils light-fixed    yes  Corneal reflexes:    Absent  Response to upper and lower airway stimulation, such as pharyngeal and endotracheal suctioning.:    Absent  Ocular response to head turning (eye movement).    Absent  Absence of Spontaneous Respirations  (Apnea test performed per Brain Death Policy. If not met due to hemodynamic/ventilatory instability, then perform EEG, TCD, or cerebral blow flow studies.)  1.   Spontaneous Respirations   Absent  2.   PaCO2 at start of apnea test:  36  3.   PaCO2 at end of apnea test:  57  4.   CO2 rise of 20 or greater from baseline:   yes  Document Confirmatory Test Utilized: (Optional) Nuclear cerebral flow, cerebral angiography (CT/MR angio), transcranial Doppler ultrasound, EEG, SSEP (record results).  Test results (if available):  CT head global cerebral edema + ICH  Patient pronounced dead by neurological criteria at 3:04PM on 03/19/2023.  Candee Furbish, MD 2023-03-19 2:49 PM

## 2023-03-08 NOTE — Progress Notes (Signed)
   NAME:  Jon Whitney, MRN:  [id number], DOB:  1946/10/09, LOS: 1 ADMISSION DATE:  02/07/2023, CONSULTATION DATE:  02/24/2023 REFERRING MD:  Sabra Heck - [facility] EM, CHIEF COMPLAINT:  OOH cardiac arrest    History of Present Illness:  77 yo M PMH OSA on CPAP, HTN, PTSD, OA / associated chronic pain who presented to [facility] via EMS 3/15 with CPR in progress.  EMS was called by pt wife for SOB/dizziness/ collapse. Was agonal with VT on EMS arrival, prior to [facility] he went into PEA. Sounds like had ROSC on and off but all together would est arrest>15 min based on chart review until sustained ROSC.  Intubated in ED, CVC placed, started on Epi. POCUS reportedly low LVEF. Rhythm maintaining as Afib RVR. Cards consulted and rec adding milrinone and NE.  EM did discuss code status w pt, is full code   PCCM was called for admission to  [facility]    In chart review, sounds like pt has had similar episodes (SOB diaphoretic dizzy +/- collapse) intermittently x65mo for several min but with spontaneous resolution. Didn't seek care/eval for this.   Pertinent  Medical History  OSA PTSD OA Anxiety HTN  Significant Hospital Events: Including procedures, antibiotic start and stop dates in addition to other pertinent events   3/15 OOH cardiac arrest, possibly shockable rhythm initially. Down time > 15 min. Afib rvr, decr LVEF. Cards consult. Epi NE milrinone. Admit to [facility]   Interim History / Subjective:  Unresponsive.  Too unstable for CT head.  Objective   Blood pressure (!) 113/58, pulse 82, temperature 98.4 F (36.9 C), temperature source Axillary, resp. rate (!) 33, height 5\' 10"  (1.778 m), weight (!) 150.5 kg, SpO2 100 %.    Vent Mode: PRVC FiO2 (%):  [80 %-100 %] 80 % Set Rate:  [24 bmp-33 bmp] 33 bmp Vt Set:  [500 mL-580 mL] 580 mL PEEP:  [10 [id number] cmH20] 10 cmH20 Plateau Pressure:  [22 cmH20-27 cmH20] 27 cmH20   Intake/Output Summary (Last 24 hours) at 2023/03/16 [id number] Last data filed at 03/16/23  0700 Gross per 24 hour  Intake 3867.94 ml  Output 735 ml  Net 3132.94 ml    Filed Weights   02/23/2023 1600 02/16/2023 1645 2023-03-16 0600  Weight: (!) 145.2 kg (!) 145.2 kg (!) 150.5 kg    Examination: Ill unresponsive No cranial nerve function Not triggering vent With deep suctioning I got a brief myoclonus  K being repleted CXR R>L infiltrates [place] okay ABG improved with high RR  Resolved Hospital Problem list     Assessment & Plan:  OHCA, VT to PEA, prolonged Post arrest AKI, acute resp failure, shock Probable aspiration OSA on CPAP, HTN, PTSD, OA / associated chronic pain   - CT head/neck - CTA chest - Pressors to MAP 65 - Sedation PRN, currently not needing any - Zosyn - GOC discussions ongoing, dismal prognosis  Best Practice (right click and "Reselect all SmartList Selections" daily)   Diet/type: NPO DVT prophylaxis: SCD GI prophylaxis: PPI Lines: Central line Foley:  Yes, and it is still needed Code Status:  full code Last date of multidisciplinary goals of care discussion 2023/03/16 see Dr Agustina Caroli note]  44 min cc time Erskine Emery MD PCCM

## 2023-03-08 NOTE — Progress Notes (Signed)
[facility] Progress Note Patient Name: NIKOLAJ AUGER DOB: 1946/10/12 MRN: [id number]   Date of Service  2023-02-22  HPI/Events of Note  K+ 3.4, PH 7.27  eICU Interventions  KCL 40 meq via OG tube x 1, respiratory rate on the ventilator increased to 33.        [name] Jil Penland February 22, 2023, 1:52 AM

## 2023-03-08 NOTE — Progress Notes (Signed)
CT head reviewed, wife bringing in family.  Erskine Emery MD PCCM

## 2023-03-08 NOTE — Progress Notes (Signed)
Benefits of IV contrast for PE study in this critically ill patient outweigh the risks.  Erskine Emery MD PCCM

## 2023-03-08 NOTE — Progress Notes (Signed)
[facility] Progress Note Patient Name: Jon Whitney DOB: 03-Dec-1946 MRN: [id number]   Date of Service  02-25-2023  HPI/Events of Note  K+ 3.2  eICU Interventions  KCL 40 meq via OG tube  x 1 + KCL 10 meq iv Q I hour x 2 ordered.        [name] Arabell Neria 02-25-2023, 5:18 AM

## 2023-03-08 NOTE — Procedures (Signed)
Extubation Procedure Note  Patient Details:   Name: Jon Whitney DOB: 1946/11/29 MRN: [id number]   Airway Documentation:  Airway 7.5 mm (Active)  Secured at (cm) 26 cm February 27, 2023 1511  Measured From Lips 2023-02-27 1511  Secured Location Right 2023-02-27 1511  Secured By [employer] February 27, 2023 1511  Tube Holder Repositioned Yes Feb 27, 2023 1511  Prone position No Feb 27, 2023 1511  Cuff Pressure (cm H2O) Clear OR 27-39 CmH2O February 27, 2023 1511  Site Condition Dry 02/27/23 1511   Vent end date: (not recorded) Vent end time: (not recorded)   Evaluation  O2 sats: stable throughout Complications: No apparent complications Patient did tolerate procedure well. Bilateral Breath Sounds: Diminished, Clear   No  Pt extubated to comfort care measures.  Lawernce Keas 02/27/23, 6:26 PM

## 2023-03-08 NOTE — Progress Notes (Signed)
   02-28-23 1413  Spiritual Encounters  Type of Visit Follow up  Care provided to: Pt and family  Conversation partners present during encounter Nurse  Referral source Nurse (RN/NT/LPN)  Reason for visit End-of-life  OnCall Visit Yes  Spiritual Framework  Presenting Themes Rituals and practive  Family Stress Factors Exhausted;Lack of knowledge  Interventions  Spiritual Care Interventions Made Compassionate presence;Reflective listening;Prayer   Received page to return to room as family received updated medical information and needed a chaplain present as doctor informed them of EOL situation. Visited room and offered comfort care to spouse, son and sister-in-law. Family requested additional prayer.  Family will need addition visit once patient transitions.

## 2023-03-08 NOTE — Progress Notes (Signed)
   03/12/2023 1214  Spiritual Encounters  Type of Visit Initial  Care provided to: [facility] partners present during encounter Nurse  Reason for visit Urgent spiritual support  OnCall Visit Yes  Spiritual Framework  Presenting Themes Values and beliefs;Significant life change;Coping tools;Courage hope and growth  Community/Connection Family  Family Stress Factors Exhausted;Family relationships;Lack of caregivers;Health changes  Goals  Self/Personal Goals Get in shape so that she can care for husband  Interventions  Spiritual Care Interventions Made Prayer;Mindfulness intervention;Narrative/life review;Compassionate presence;Established relationship of care and support;Reflective listening;Encouragement  Intervention Outcomes  Outcomes Connection to spiritual care;Reduced anxiety;Reduced isolation  Spiritual Care Plan  Spiritual Care Issues Still Outstanding No further spiritual care needs at this time (see row info)   Responded to page to visit patient's room as wife was needing support and prayer. Talked to spouse and listened to spouse as she reviewed the couples struggles and negative treatment they received as an interracial and blended family. Patient is a Vet with 2 purple heart, however he and his wife suffer from PTSD. Wife is primary care taker and felt at a loss because she soul not care for him and was not understanding what all was going on in the patient's room. Advocated with medical team for better understanding. Patient's son returning with lunch for his mom. Prayed with spouse.

## 2023-03-08 NOTE — Progress Notes (Signed)
Rounding Note    Patient Name: RAKEEM HOWENSTINE Date of Encounter: 02/22/23  [place] Cardiologist: Chalmers Guest, MD   Subjective   Seen in the ICU, Room 2H11 with wife and sister in law at bedside. Discussed in detail with patient's RN Elmyra Ricks. OOH cardiac arrest found to be in VF and PEA, ROSC achieved, was in afib RVR until this morning now in sinus rhythm with occasional PVCs.  Pt is cool in his extremities but warm proximally. On no sedation and not responsive to verbal commands. On the ventilator. Continues on epinephrine, vasopressin and norepinephrine. Continues on amiodarone and magnesium.   CVP 9, coox pending.   Inpatient Medications    Scheduled Meds:  acetaminophen  650 mg Oral Q4H   Or   acetaminophen (TYLENOL) oral liquid 160 mg/5 mL  650 mg Per Tube Q4H   Or   acetaminophen  650 mg Rectal Q4H   Chlorhexidine Gluconate Cloth  6 each Topical Daily   docusate  100 mg Per Tube BID   famotidine  20 mg Per Tube BID   heparin  5,000 Units Subcutaneous Q8H   insulin aspart  0-20 Units Subcutaneous Q4H   insulin detemir  20 Units Subcutaneous BID   mouth rinse  15 mL Mouth Rinse Q2H   pantoprazole (PROTONIX) IV  40 mg Intravenous QHS   polyethylene glycol  17 g Per Tube Daily   sodium chloride flush  10-40 mL Intracatheter Q12H   Continuous Infusions:  sodium chloride 10 mL/hr at 02/22/23 0900   sodium chloride     amiodarone 30 mg/hr (Feb 22, 2023 0900)   cefTRIAXone (ROCEPHIN)  IV 2 g (02-22-2023 0907)   epinephrine 9 mcg/min (02-22-2023 0900)   lactated ringers 10 mL/hr at 2023-02-22 0900   magnesium sulfate     magnesium sulfate bolus IVPB 2 g (02-22-2023 0945)   norepinephrine (LEVOPHED) Adult infusion 34 mcg/min (02-22-23 0900)   norepinephrine Stopped (02/22/23 0821)   vasopressin 0.03 Units/min (02/22/23 0900)   PRN Meds: Place/Maintain arterial line **AND** sodium chloride, [START ON 02/21/2023] acetaminophen **OR** [START ON 02/21/2023]  acetaminophen (TYLENOL) oral liquid 160 mg/5 mL **OR** [START ON 02/21/2023] acetaminophen, acetaminophen, busPIRone **OR** busPIRone, docusate sodium, magnesium sulfate, norepinephrine, ondansetron (ZOFRAN) IV, mouth rinse, polyethylene glycol, sodium chloride flush   Vital Signs    Vitals:   2023/02/22 0815 Feb 22, 2023 0830 02-22-2023 0845 22-Feb-2023 0900  BP:      Pulse: 69 72 69 70  Resp: (!) 33 (!) 33 (!) 33 (!) 33  Temp:      TempSrc:      SpO2: 100% 100% 100% 100%  Weight:      Height:        Intake/Output Summary (Last 24 hours) at 02/22/23 1009 Last data filed at 2023-02-22 0924 Gross per 24 hour  Intake 4264.67 ml  Output 760 ml  Net 3504.67 ml      Feb 22, 2023    6:00 AM 02/10/2023    4:45 PM 02/25/2023    4:00 PM  Last 3 Weights  Weight (lbs) 331 lb 12.7 oz 320 lb 320 lb  Weight (kg) 150.5 kg 145.151 kg 145.151 kg      Telemetry    SR now - Personally Reviewed  ECG    No new - Personally Reviewed  Physical Exam   GEN: intubated, nonresponsive Neck: JVD not well appreciated Cardiac: RRR, no murmurs Respiratory: Clear anteriorly, ventilator breath sounds. GI: Soft, distended but not tense MS: No edema  Neuro:  not responsive, not on sedation.   Psych: not able to assess.  Labs    High Sensitivity Troponin:   Recent Labs  Lab 03/06/2023 1540  TROPONINIHS 19*     Chemistry Recent Labs  Lab 02/05/2023 1521 02/18/2023 1540 02/28/2023 1554 03/06/2023 1943 03/01/23 0036 01-Mar-2023 0047 March 01, 2023 0359 01-Mar-2023 0410 03/01/2023 0554 March 01, 2023 0757  NA 137 131* 134*   < > 133*   < > 134* 130*  --  132*  K 4.6 3.2* 3.3*   < > 3.4*   < > 3.2* 3.2*  --  4.0  CL 100 102 103  --  99  --   --  100  --   --   CO2 27 16*  --   --  20*  --   --  19*  --   --   GLUCOSE 351* 318* 344*  --  315*  --   --  298*  --   --   BUN 17 14 16   --  21  --   --  21  --   --   CREATININE 1.93* 1.35* 1.20  --  2.34*  --   --  2.58*  --   --   CALCIUM 7.8* 8.1*  --   --  8.0*  --   --  7.9*   --   --   MG  --   --   --   --   --   --   --   --  1.8  --   PROT 6.0*  --   --   --  6.1*  --   --   --   --   --   ALBUMIN 3.0*  --   --   --  3.2*  --   --   --   --   --   AST 53*  --   --   --  64*  --   --   --   --   --   ALT 38  --   --   --  43  --   --   --   --   --   ALKPHOS 68  --   --   --  72  --   --   --   --   --   BILITOT 0.7  --   --   --  1.0  --   --   --   --   --   GFRNONAA 35* 54*  --   --  28*  --   --  25*  --   --   ANIONGAP 10 13  --   --  14  --   --  11  --   --    < > = values in this interval not displayed.    Lipids No results for input(s): "CHOL", "TRIG", "HDL", "LABVLDL", "LDLCALC", "CHOLHDL" in the last 168 hours.  Hematology Recent Labs  Lab 03/01/2023 1540 03/01/2023 1554 03/01/2023 0036 01-Mar-2023 0047 03-01-2023 0359 2023-03-01 0757  WBC 11.7*  --  15.2*  --   --   --   RBC 5.63  --  5.55  --   --   --   HGB 15.2   < > 15.0 17.0 16.7 16.3  HCT 52.1*   < > 48.6 50.0 49.0 48.0  MCV 92.5  --  87.6  --   --   --  MCH 27.0  --  27.0  --   --   --   MCHC 29.2*  --  30.9  --   --   --   RDW 15.7*  --  15.4  --   --   --   PLT 124*  --  159  --   --   --    < > = values in this interval not displayed.   Thyroid No results for input(s): "TSH", "FREET4" in the last 168 hours.  BNP Recent Labs  Lab 02/25/2023 1521 03/06/2023 1616 02/25/23 0036  BNP 53.0 390.0* 110.5*    DDimer No results for input(s): "DDIMER" in the last 168 hours.   Radiology    EEG adult  Result Date: 25-Feb-2023 Lora Havens, MD     02/25/23  8:35 AM Patient Name: JESI BOSSERMAN MRN: [id number] Epilepsy Attending: Lora Havens Referring Physician/Provider: Cristal Generous, NP Date: 02/10/2023 Duration: 21.56 mins Patient history: 77yo M s/p cardiac arrest. EEG to evaluate for seizure. Level of alertness:  comatose AEDs during EEG study: None Technical aspects: This EEG study was done with scalp electrodes positioned according to the 10-20 International system of  electrode placement. Electrical activity was reviewed with band pass filter of 1-70Hz , sensitivity of 7 uV/mm, display speed of 70mm/sec with a 60Hz  notched filter applied as appropriate. EEG data were recorded continuously and digitally stored.  Video monitoring was available and reviewed as appropriate. Description: EEG showed continuous generalized background suppression, not reactive to stimuli. Hyperventilation and photic stimulation were not performed.   ABNORMALITY - Background suppression, generalized IMPRESSION: This study is suggestive of profound diffuse encephalopathy, nonspecific etiology but could be secondary to anoxic/hypoxic brain injury. No seizures or epileptiform discharges were seen throughout the recording. Lora Havens   DG CHEST PORT 1 VIEW  Result Date: 02/06/2023 CLINICAL DATA:  Evaluate orogastric and of tracheal tube positioning. EXAM: PORTABLE CHEST 1 VIEW COMPARISON:  February 19, 2023 (5:29 p.m.) FINDINGS: There is stable endotracheal tube and right internal jugular venous catheter positioning. Interval orogastric tube placement is noted with its distal end extending below the level of the diaphragm. The distal tip is seen overlying the left upper quadrant. The cardiac silhouette is mildly enlarged and unchanged in size. Persistent left suprahilar, right perihilar and right upper lobe airspace disease is noted. There is no evidence of a pleural effusion or pneumothorax. The visualized skeletal structures are unremarkable. IMPRESSION: 1. Interval orogastric tube placement, as described above. 2. Persistent left suprahilar, right perihilar and right upper lobe airspace disease. Electronically Signed   By: Virgina Norfolk M.D.   On: 02/17/2023 19:37   DG Abd Portable 1V  Result Date: 03/04/2023 CLINICAL DATA:  NG tube placement. EXAM: PORTABLE ABDOMEN - 1 VIEW COMPARISON:  Chest x-ray from earlier same day FINDINGS: NG tube enters the stomach with the tip positioned in the  gastric fundus region. Visualized portion of the upper abdomen demonstrates nonspecific bowel gas pattern. IMPRESSION: NG tube tip is in the gastric fundus. Electronically Signed   By: Misty Stanley M.D.   On: 02/23/2023 19:31   DG Chest Portable 1 View  Result Date: 02/24/2023 CLINICAL DATA:  Central line verification EXAM: PORTABLE CHEST 1 VIEW COMPARISON:  None Available. FINDINGS: Endotracheal tube unchanged. Introduction of RIGHT central venous line with tip in the mid SVC. No pneumothorax. Perihilar airspace disease again noted. Low lung volumes. IMPRESSION: RIGHT central venous line introduced without complication. Nodular airspace disease pattern  Electronically Signed   By: Suzy Bouchard M.D.   On: 02/16/2023 17:46   DG Chest Port 1 View  Result Date: 02/09/2023 CLINICAL DATA:  [id number] Chest pain in adult [id number] EXAM: PORTABLE CHEST 1 VIEW COMPARISON:  Radiograph 08/16/2014 FINDINGS: Endotracheal tube overlies the midthoracic trachea approximately 4.0 cm above the carina. The fibrillator pad overlies the left chest. Unchanged mildly enlarged cardiac silhouette. There are diffuse interstitial opacities and right lung predominant airspace opacities. No large effusion or evidence of pneumothorax. Thoracic spondylosis. No definite acute osseous abnormality on single frontal view. IMPRESSION: Endotracheal tube overlies the trachea approximately 4.0 cm above the carina. Diffuse interstitial opacities with right lung predominant airspace disease, favoring pulmonary edema, infection is possible in the appropriate clinical setting. Electronically Signed   By: Maurine Simmering M.D.   On: 02/12/2023 16:25    Cardiac Studies   Echo pending  Patient Profile     77 y.o. male who suffered out of hospital VF arrest.   Assessment & Plan    Principal Problem:   Cardiac arrest [facility]) Active Problems:   Shock ([place])   Ventricular arrhythmia   Acute respiratory failure with hypoxia ([place])   New onset a-fib  ([place])  Neuro - per nursing, pupils fixed, not responsive on no sedation.  Neuro on board, EEG shows profound diffuse encephalopathy, nonspecific but could represent anoxic brain injury. No seizures.  Cardiac - cool on exam, CVP 9, on epi, norepi and vasopressin but with slightly decreased requirement.  - echo pending - co2 19. Cvp 9, coox pending. -essentially anuric, may need lasix challenge or consideration of CRRT - will continue current pressors for now, initial troponin was 19  despite ACLS.    Pulm - vent, per CCM  Renal - anuric. Will get coox, consider lasix challenge and CRRT       For questions or updates, please contact [place] Please consult [email or web address] for contact info under        Signed, Elouise Munroe, MD  03/21/2023, 10:09 AM    CRITICAL CARE Performed by: Cherlynn Kaiser, MD   Total critical care time: 45 minutes   Critical care time was exclusive of separately billable procedures and treating other patients.   Critical care was necessary to treat or prevent imminent or life-threatening deterioration.   Critical care was time spent personally by me (independent of APPs or residents) on the following activities: development of treatment plan with patient and/or surrogate as well as nursing, discussions with consultants, evaluation of patient's response to treatment, examination of patient, obtaining history from patient or surrogate, ordering and performing treatments and interventions, ordering and review of laboratory studies, ordering and review of radiographic studies, pulse oximetry and re-evaluation of patient's condition.

## 2023-03-08 NOTE — Progress Notes (Signed)
Patient declared brain dead, family informed and are awaiting daughters to say goodbye to body.  Erskine Emery MD PCCM

## 2023-03-08 NOTE — Progress Notes (Signed)
Pt transported to and from CT without event.  

## 2023-03-08 NOTE — Progress Notes (Signed)
Per CCM, Rtx3 performed apnea test w/ CCM and RN at bedside. Pt initial ABG 7.40/35.5/356/22.5. Cuff deflated and pt disconnected from the vent at 2:52pm. [place] tubing at 8LPM placed down the ETT. After 8 minutes at 3:00 pm. Pt placed back on vent w/ cuff inflated and ABG obtained. 7.25/56.6/92/25.2. No chest rise or initiated breaths during test. Post ABG results given to Dr. Tamala Julian.

## 2023-03-08 NOTE — Progress Notes (Signed)
   03-22-23 1825  Spiritual Encounters  Type of Visit Follow up  Care provided to: Pt and family  Conversation partners present during encounter Nurse  Referral source Nurse (RN/NT/LPN);Family  Reason for visit End-of-life  OnCall Visit Yes  Spiritual Framework  Presenting Themes Rituals and practive   Gathered with family of 9 members as patient took last breath.  Family played music and sung songs. Chaplain led each family member to say their goodbyes or whatever was on their heart.  Chaplain prayed with family.

## 2023-03-08 DEATH — deceased

## 2023-03-30 ENCOUNTER — Ambulatory Visit: Payer: Medicare Other | Admitting: Orthopaedic Surgery

## 2023-04-07 NOTE — Death Summary Note (Signed)
DEATH SUMMARY   Patient Details  Name: Jon Whitney MRN: [id number] DOB: [birth year]  Admission/Discharge Information   Admit Date:  March 14, 2023  Date of Death: Date of Death: 03-15-23  Time of Death: Time of Death: 04-07-1502  Length of Stay: 1  Referring Physician: Sharilyn Sites, MD   Reason(s) for Hospitalization  {Trauma:[id number]::"***"}  Diagnoses  Preliminary cause of death:  Secondary Diagnoses (including complications and co-morbidities):  Principal Problem:   Cardiac arrest Active Problems:   Shock   Ventricular arrhythmia   Acute respiratory failure with hypoxia   New onset a-fib   Brief Hospital Course (including significant findings, care, treatment, and services provided and events leading to death)  Jon Whitney is a 77 y.o. year old male who ***    Pertinent Labs and Studies  Significant Diagnostic Studies DG Chest Port 1 View  Result Date: March 15, 2023 CLINICAL DATA:  Endotracheally intubated. EXAM: PORTABLE CHEST 1 VIEW COMPARISON:  Subsequent chest CTA, available at time of radiograph interpretation. Radiograph yesterday. FINDINGS: Stable positioning of endotracheal tube, enteric tube, and right central line. Diffuse airspace opacity in the right hemithorax with progression from yesterday's radiograph. The lesser left lung consolidation on CT is not as well seen by radiograph. Stable cardiomegaly and mediastinal contours. No pneumothorax or large pleural effusion. IMPRESSION: 1. Diffuse airspace opacity in the right hemithorax with progression from yesterday's radiograph. 2. Stable cardiomegaly. 3. Stable support apparatus. Electronically Signed   By: Keith Rake M.D.   On: 2023/03/15 15:47   CT HEAD WO CONTRAST (5MM)  Result Date: Mar 15, 2023 CLINICAL DATA:  Neck trauma.  Anoxic brain injury EXAM: CT HEAD WITHOUT CONTRAST CT CERVICAL SPINE WITHOUT CONTRAST TECHNIQUE: Multidetector CT imaging of the head and cervical spine was performed following the  standard protocol without intravenous contrast. Multiplanar CT image reconstructions of the cervical spine were also generated. RADIATION DOSE REDUCTION: This exam was performed according to the departmental dose-optimization program which includes automated exposure control, adjustment of the mA and/or kV according to patient size and/or use of iterative reconstruction technique. COMPARISON:  None Available. FINDINGS: CT HEAD FINDINGS Brain: Generalized edematous appearance of the brain affecting both infra and supratentorial levels was pseudo subarachnoid sign at the dural reflections. There is some subarachnoid hemorrhage in the prepontine and right ambient cisterns especially. High-density clot seen throughout the ventricular system with mild dilatation. Diffuse effacement of sulci. No midline shift. Downward central herniation. Vascular: Atheromatous calcification. Skull: Negative Sinuses/Orbits: Mucosal thickening in the paranasal sinuses. Partial left mastoid opacification. CT CERVICAL SPINE FINDINGS Alignment: Normal Skull base and vertebrae: No acute fracture. No primary bone lesion or focal pathologic process. Soft tissues and spinal canal: No prevertebral fluid or swelling. No visible canal hematoma. Disc levels: Ordinary cervical spine degeneration which generalized facet and endplate spurring. Upper chest: Edematous appearance in the apical lungs. Critical Value/emergent results were called by telephone at the time of interpretation on 03/15/23 at 12:52 pm to provider [facility] , who is aware. IMPRESSION: 1. Global anoxic injury with elevated intracranial pressure and downward central herniation. 2. Diffuse intraventricular hemorrhage with ventriculomegaly. Contiguous subarachnoid hemorrhage at the basal cisterns. A discrete source is not seen. 3. Negative for cervical spine fracture. Electronically Signed   By: Jorje Guild M.D.   On: 03-15-2023 12:52   CT CERVICAL SPINE WO CONTRAST  Result  Date: Mar 15, 2023 CLINICAL DATA:  Neck trauma.  Anoxic brain injury EXAM: CT HEAD WITHOUT CONTRAST CT CERVICAL SPINE WITHOUT CONTRAST TECHNIQUE: Multidetector CT imaging of the  head and cervical spine was performed following the standard protocol without intravenous contrast. Multiplanar CT image reconstructions of the cervical spine were also generated. RADIATION DOSE REDUCTION: This exam was performed according to the departmental dose-optimization program which includes automated exposure control, adjustment of the mA and/or kV according to patient size and/or use of iterative reconstruction technique. COMPARISON:  None Available. FINDINGS: CT HEAD FINDINGS Brain: Generalized edematous appearance of the brain affecting both infra and supratentorial levels was pseudo subarachnoid sign at the dural reflections. There is some subarachnoid hemorrhage in the prepontine and right ambient cisterns especially. High-density clot seen throughout the ventricular system with mild dilatation. Diffuse effacement of sulci. No midline shift. Downward central herniation. Vascular: Atheromatous calcification. Skull: Negative Sinuses/Orbits: Mucosal thickening in the paranasal sinuses. Partial left mastoid opacification. CT CERVICAL SPINE FINDINGS Alignment: Normal Skull base and vertebrae: No acute fracture. No primary bone lesion or focal pathologic process. Soft tissues and spinal canal: No prevertebral fluid or swelling. No visible canal hematoma. Disc levels: Ordinary cervical spine degeneration which generalized facet and endplate spurring. Upper chest: Edematous appearance in the apical lungs. Critical Value/emergent results were called by telephone at the time of interpretation on 2023/03/03 at 12:52 pm to provider [facility] , who is aware. IMPRESSION: 1. Global anoxic injury with elevated intracranial pressure and downward central herniation. 2. Diffuse intraventricular hemorrhage with ventriculomegaly. Contiguous  subarachnoid hemorrhage at the basal cisterns. A discrete source is not seen. 3. Negative for cervical spine fracture. Electronically Signed   By: Jorje Guild M.D.   On: March 03, 2023 12:52   ECHOCARDIOGRAM COMPLETE  Result Date: 03-Mar-2023    ECHOCARDIOGRAM REPORT   Patient Name:   Jon Whitney Date of Exam: 03-03-2023 Medical Rec #:  [id number]          Height:       70.0 in Accession #:    [id number]         Weight:       331.8 lb Date of Birth:  02-18-1946          BSA:          2.588 m Patient Age:    55 years           BP:           130/72 mmHg Patient Gender: M                  HR:           68 bpm. Exam Location:  Inpatient Procedure: 2D Echo, Cardiac Doppler, Color Doppler and Intracardiac            Opacification Agent Indications:    cardiac arrest  History:        Patient has no prior history of Echocardiogram examinations.                 Arrythmias:Tachycardia, Ventricular Fibrillation and Cardiac                 Arrest; Risk Factors:Hypertension.  Sonographer:    Melissa Morford RDCS (AE, PE) Referring Phys: [id number] GRACE E BOWSER IMPRESSIONS  1. Left ventricular ejection fraction, by estimation, is 60 to 65%. The left ventricle has normal function. The left ventricle has no regional wall motion abnormalities. There is moderate left ventricular hypertrophy. Left ventricular diastolic parameters are consistent with Grade I diastolic dysfunction (impaired relaxation).  2. Right ventricular systolic function is normal. The right ventricular size is normal. Tricuspid regurgitation signal is  inadequate for assessing PA pressure.  3. The mitral valve is grossly normal. Trivial mitral valve regurgitation.  4. The aortic valve was not well visualized. Aortic valve regurgitation is not visualized. No aortic stenosis is present. FINDINGS  Left Ventricle: Left ventricular ejection fraction, by estimation, is 60 to 65%. The left ventricle has normal function. The left ventricle has no regional wall  motion abnormalities. Definity contrast agent was given IV to delineate the left ventricular  endocardial borders. The left ventricular internal cavity size was normal in size. There is moderate left ventricular hypertrophy. Left ventricular diastolic parameters are consistent with Grade I diastolic dysfunction (impaired relaxation). Right Ventricle: The right ventricular size is normal. Right vetricular wall thickness was not well visualized. Right ventricular systolic function is normal. Tricuspid regurgitation signal is inadequate for assessing PA pressure. Left Atrium: Left atrial size was normal in size. Right Atrium: Right atrial size was normal in size. Pericardium: There is no evidence of pericardial effusion. Mitral Valve: The mitral valve is grossly normal. Trivial mitral valve regurgitation. Tricuspid Valve: The tricuspid valve is normal in structure. Tricuspid valve regurgitation is not demonstrated. Aortic Valve: The aortic valve was not well visualized. Aortic valve regurgitation is not visualized. No aortic stenosis is present. Pulmonic Valve: The pulmonic valve was not well visualized. Pulmonic valve regurgitation is not visualized. Aorta: The aortic root is normal in size and structure. Venous: IVC assessment for right atrial pressure unable to be performed due to mechanical ventilation. IAS/Shunts: The atrial septum is grossly normal.  LEFT VENTRICLE PLAX 2D LVIDd:         4.80 cm   Diastology LVIDs:         3.50 cm   LV e' medial:    5.44 cm/s LV PW:         1.60 cm   LV E/e' medial:  8.4 LV IVS:        1.40 cm   LV e' lateral:   4.68 cm/s LVOT diam:     2.00 cm   LV E/e' lateral: 9.7 LV SV:         52 LV SV Index:   20 LVOT Area:     3.14 cm  RIGHT VENTRICLE RV S prime:     9.79 cm/s TAPSE (M-mode): 1.5 cm LEFT ATRIUM             Index        RIGHT ATRIUM           Index LA diam:        4.00 cm 1.55 cm/m   RA Area:     23.80 cm LA Vol (A2C):   68.6 ml 26.50 ml/m  RA Volume:   69.90 ml  27.01  ml/m LA Vol (A4C):   42.4 ml 16.38 ml/m LA Biplane Vol: 53.9 ml 20.82 ml/m  AORTIC VALVE LVOT Vmax:   122.00 cm/s LVOT Vmean:  80.800 cm/s LVOT VTI:    0.166 m  AORTA Ao Root diam: 3.50 cm MITRAL VALVE MV Area (PHT): 2.90 cm    SHUNTS MV Decel Time: 262 msec    Systemic VTI:  0.17 m MV E velocity: 45.60 cm/s  Systemic Diam: 2.00 cm MV A velocity: 48.20 cm/s MV E/A ratio:  0.95 Cherlynn Kaiser MD Electronically signed by Cherlynn Kaiser MD Signature Date/Time: 03/18/2023/12:39:06 PM    Final    CT Angio Chest Pulmonary Embolism (PE) W or WO Contrast  Result Date: 2023-03-18 CLINICAL DATA:  Possible pulmonary  embolism. EXAM: CT ANGIOGRAPHY CHEST WITH CONTRAST TECHNIQUE: Multidetector CT imaging of the chest was performed using the standard protocol during bolus administration of intravenous contrast. Multiplanar CT image reconstructions and MIPs were obtained to evaluate the vascular anatomy. RADIATION DOSE REDUCTION: This exam was performed according to the departmental dose-optimization program which includes automated exposure control, adjustment of the mA and/or kV according to patient size and/or use of iterative reconstruction technique. CONTRAST:  85mL ISOVUE-370 IOPAMIDOL (ISOVUE-370) INJECTION 76% COMPARISON:  None Available. FINDINGS: Cardiovascular: Mild cardiomegaly. Minimal left main and three-vessel coronary artery disease. Thoracic aorta is normal in caliber. Minimal calcified plaque over the descending thoracic aorta. Pulmonary arterial system is adequately opacified without evidence of emboli. Remaining vascular structures are unremarkable. Mediastinum/Nodes: Endotracheal tube is present with tip 4.6 cm above the carina nasogastric tube is present with tip in the gastric fundus. No evidence of mediastinal or hilar adenopathy. Remaining mediastinal structures are unremarkable. Lungs/Pleura: Posterior consolidation over the mid to lower lungs likely atelectasis. Patchy hazy airspace opacification  over the upper lobes right worse than left which may be due to noncardiogenic edema versus an infection or inflammatory process. No effusion. Airways are unremarkable. Upper Abdomen: Nasogastric tube with tip over the gastric fundus. No acute findings. Musculoskeletal: No focal abnormality. Review of the MIP images confirms the above findings. IMPRESSION: 1. No evidence of pulmonary embolism. 2. Posterior consolidation over the mid to lower lungs likely atelectasis. Patchy hazy airspace opacification over the upper lobes right worse than left which may be due to aninfectious or inflammatory process versus noncardiogenic edema. 3. Mild cardiomegaly with minimal left main and three-vessel coronary artery disease. 4. Aortic atherosclerosis. Aortic Atherosclerosis (ICD10-I70.0). Electronically Signed   By: Marin Olp M.D.   On: 21-Feb-2023 12:39   EEG adult  Result Date: 21-Feb-2023 Lora Havens, MD     2023-02-21  8:35 AM Patient Name: Jon Whitney MRN: [id number] Epilepsy Attending: Lora Havens Referring Physician/Provider: Cristal Generous, NP Date: 02/13/2023 Duration: 21.56 mins Patient history: 77yo M s/p cardiac arrest. EEG to evaluate for seizure. Level of alertness:  comatose AEDs during EEG study: None Technical aspects: This EEG study was done with scalp electrodes positioned according to the 10-20 International system of electrode placement. Electrical activity was reviewed with band pass filter of 1-70Hz , sensitivity of 7 uV/mm, display speed of 42mm/sec with a 60Hz  notched filter applied as appropriate. EEG data were recorded continuously and digitally stored.  Video monitoring was available and reviewed as appropriate. Description: EEG showed continuous generalized background suppression, not reactive to stimuli. Hyperventilation and photic stimulation were not performed.   ABNORMALITY - Background suppression, generalized IMPRESSION: This study is suggestive of profound diffuse  encephalopathy, nonspecific etiology but could be secondary to anoxic/hypoxic brain injury. No seizures or epileptiform discharges were seen throughout the recording. Lora Havens   DG CHEST PORT 1 VIEW  Result Date: 02/08/2023 CLINICAL DATA:  Evaluate orogastric and of tracheal tube positioning. EXAM: PORTABLE CHEST 1 VIEW COMPARISON:  February 19, 2023 (5:29 p.m.) FINDINGS: There is stable endotracheal tube and right internal jugular venous catheter positioning. Interval orogastric tube placement is noted with its distal end extending below the level of the diaphragm. The distal tip is seen overlying the left upper quadrant. The cardiac silhouette is mildly enlarged and unchanged in size. Persistent left suprahilar, right perihilar and right upper lobe airspace disease is noted. There is no evidence of a pleural effusion or pneumothorax. The visualized skeletal structures are unremarkable.  IMPRESSION: 1. Interval orogastric tube placement, as described above. 2. Persistent left suprahilar, right perihilar and right upper lobe airspace disease. Electronically Signed   By: Virgina Norfolk M.D.   On: 02/08/2023 19:37   DG Abd Portable 1V  Result Date: 02/08/2023 CLINICAL DATA:  NG tube placement. EXAM: PORTABLE ABDOMEN - 1 VIEW COMPARISON:  Chest x-ray from earlier same day FINDINGS: NG tube enters the stomach with the tip positioned in the gastric fundus region. Visualized portion of the upper abdomen demonstrates nonspecific bowel gas pattern. IMPRESSION: NG tube tip is in the gastric fundus. Electronically Signed   By: Misty Stanley M.D.   On: 03/03/2023 19:31   DG Chest Portable 1 View  Result Date: 02/07/2023 CLINICAL DATA:  Central line verification EXAM: PORTABLE CHEST 1 VIEW COMPARISON:  None Available. FINDINGS: Endotracheal tube unchanged. Introduction of RIGHT central venous line with tip in the mid SVC. No pneumothorax. Perihilar airspace disease again noted. Low lung volumes. IMPRESSION:  RIGHT central venous line introduced without complication. Nodular airspace disease pattern Electronically Signed   By: Suzy Bouchard M.D.   On: 02/07/2023 17:46   DG Chest Port 1 View  Result Date: 02/18/2023 CLINICAL DATA:  [id number] Chest pain in adult [id number] EXAM: PORTABLE CHEST 1 VIEW COMPARISON:  Radiograph 08/16/2014 FINDINGS: Endotracheal tube overlies the midthoracic trachea approximately 4.0 cm above the carina. The fibrillator pad overlies the left chest. Unchanged mildly enlarged cardiac silhouette. There are diffuse interstitial opacities and right lung predominant airspace opacities. No large effusion or evidence of pneumothorax. Thoracic spondylosis. No definite acute osseous abnormality on single frontal view. IMPRESSION: Endotracheal tube overlies the trachea approximately 4.0 cm above the carina. Diffuse interstitial opacities with right lung predominant airspace disease, favoring pulmonary edema, infection is possible in the appropriate clinical setting. Electronically Signed   By: Maurine Simmering M.D.   On: 02/10/2023 16:25    Microbiology No results found for this or any previous visit (from the past 240 hour(s)).  Lab Basic Metabolic Panel: No results for input(s): "NA", "K", "CL", "CO2", "GLUCOSE", "BUN", "CREATININE", "CALCIUM", "MG", "PHOS" in the last 168 hours. Liver Function Tests: No results for input(s): "AST", "ALT", "ALKPHOS", "BILITOT", "PROT", "ALBUMIN" in the last 168 hours. No results for input(s): "LIPASE", "AMYLASE" in the last 168 hours. No results for input(s): "AMMONIA" in the last 168 hours. CBC: No results for input(s): "WBC", "NEUTROABS", "HGB", "HCT", "MCV", "PLT" in the last 168 hours. Cardiac Enzymes: No results for input(s): "CKTOTAL", "CKMB", "CKMBINDEX", "TROPONINI" in the last 168 hours. Sepsis Labs: No results for input(s): "PROCALCITON", "WBC", "LATICACIDVEN" in the last 168 hours.  Procedures/Operations  ***   Candee Furbish 03/08/2023,  11:58 PM
